# Patient Record
Sex: Female | Born: 1960 | Race: Black or African American | Hispanic: No | Marital: Single | Smoking: Former smoker
Health system: Southern US, Community
[De-identification: ages and names within clinical notes are randomized; demographics above are authoritative.]

## PROBLEM LIST (undated history)

## (undated) DIAGNOSIS — E039 Hypothyroidism, unspecified: Secondary | ICD-10-CM

## (undated) DIAGNOSIS — R1032 Left lower quadrant pain: Secondary | ICD-10-CM

## (undated) DIAGNOSIS — I10 Essential (primary) hypertension: Secondary | ICD-10-CM

## (undated) DIAGNOSIS — N920 Excessive and frequent menstruation with regular cycle: Secondary | ICD-10-CM

## (undated) DIAGNOSIS — J45909 Unspecified asthma, uncomplicated: Secondary | ICD-10-CM

## (undated) DIAGNOSIS — E669 Obesity, unspecified: Secondary | ICD-10-CM

## (undated) DIAGNOSIS — F419 Anxiety disorder, unspecified: Secondary | ICD-10-CM

## (undated) DIAGNOSIS — D219 Benign neoplasm of connective and other soft tissue, unspecified: Secondary | ICD-10-CM

## (undated) DIAGNOSIS — D649 Anemia, unspecified: Secondary | ICD-10-CM

## (undated) DIAGNOSIS — C519 Malignant neoplasm of vulva, unspecified: Secondary | ICD-10-CM

## (undated) DIAGNOSIS — N946 Dysmenorrhea, unspecified: Secondary | ICD-10-CM

## (undated) HISTORY — DX: Excessive and frequent menstruation with regular cycle: N92.0

## (undated) HISTORY — PX: ENDOMETRIAL BIOPSY: SHX622

## (undated) HISTORY — PX: ROUX-EN-Y GASTRIC BYPASS: SHX1104

## (undated) HISTORY — PX: OTHER SURGICAL HISTORY: SHX169

## (undated) HISTORY — PX: ABDOMINAL HYSTERECTOMY: SHX81

## (undated) HISTORY — DX: Anxiety disorder, unspecified: F41.9

## (undated) HISTORY — DX: Obesity, unspecified: E66.9

## (undated) HISTORY — DX: Malignant neoplasm of vulva, unspecified: C51.9

## (undated) HISTORY — DX: Benign neoplasm of connective and other soft tissue, unspecified: D21.9

## (undated) HISTORY — DX: Dysmenorrhea, unspecified: N94.6

## (undated) HISTORY — DX: Left lower quadrant pain: R10.32

## (undated) HISTORY — DX: Anemia, unspecified: D64.9

## (undated) HISTORY — DX: Essential (primary) hypertension: I10

---

## 2000-03-22 ENCOUNTER — Emergency Department (HOSPITAL_COMMUNITY): Admission: EM | Admit: 2000-03-22 | Discharge: 2000-03-22 | Payer: Self-pay | Admitting: Emergency Medicine

## 2000-03-22 ENCOUNTER — Encounter: Payer: Self-pay | Admitting: Emergency Medicine

## 2001-06-09 ENCOUNTER — Encounter: Payer: Self-pay | Admitting: Family Medicine

## 2001-06-09 ENCOUNTER — Ambulatory Visit (HOSPITAL_COMMUNITY): Admission: RE | Admit: 2001-06-09 | Discharge: 2001-06-09 | Payer: Self-pay | Admitting: Family Medicine

## 2001-07-15 ENCOUNTER — Encounter: Admission: RE | Admit: 2001-07-15 | Discharge: 2001-10-13 | Payer: Self-pay | Admitting: Family Medicine

## 2001-11-18 ENCOUNTER — Encounter: Admission: RE | Admit: 2001-11-18 | Discharge: 2001-11-18 | Payer: Self-pay | Admitting: Family Medicine

## 2002-01-17 ENCOUNTER — Inpatient Hospital Stay (HOSPITAL_COMMUNITY): Admission: RE | Admit: 2002-01-17 | Discharge: 2002-01-24 | Payer: Self-pay | Admitting: Obstetrics and Gynecology

## 2002-01-17 ENCOUNTER — Encounter (INDEPENDENT_AMBULATORY_CARE_PROVIDER_SITE_OTHER): Payer: Self-pay | Admitting: Specialist

## 2002-01-19 ENCOUNTER — Encounter: Payer: Self-pay | Admitting: Obstetrics and Gynecology

## 2002-01-20 ENCOUNTER — Encounter: Payer: Self-pay | Admitting: Obstetrics and Gynecology

## 2002-09-28 ENCOUNTER — Encounter (INDEPENDENT_AMBULATORY_CARE_PROVIDER_SITE_OTHER): Payer: Self-pay | Admitting: *Deleted

## 2002-09-28 ENCOUNTER — Encounter: Payer: Self-pay | Admitting: Family Medicine

## 2002-09-28 ENCOUNTER — Ambulatory Visit (HOSPITAL_COMMUNITY): Admission: RE | Admit: 2002-09-28 | Discharge: 2002-09-28 | Payer: Self-pay | Admitting: Family Medicine

## 2002-10-31 ENCOUNTER — Other Ambulatory Visit: Admission: RE | Admit: 2002-10-31 | Discharge: 2002-10-31 | Payer: Self-pay | Admitting: Obstetrics and Gynecology

## 2003-03-21 ENCOUNTER — Encounter: Admission: RE | Admit: 2003-03-21 | Discharge: 2003-06-19 | Payer: Self-pay | Admitting: Family Medicine

## 2005-05-06 ENCOUNTER — Other Ambulatory Visit: Admission: RE | Admit: 2005-05-06 | Discharge: 2005-05-06 | Payer: Self-pay | Admitting: Family Medicine

## 2007-02-10 ENCOUNTER — Ambulatory Visit (HOSPITAL_COMMUNITY): Admission: RE | Admit: 2007-02-10 | Discharge: 2007-02-10 | Payer: Self-pay | Admitting: General Surgery

## 2007-02-11 ENCOUNTER — Ambulatory Visit (HOSPITAL_COMMUNITY): Admission: RE | Admit: 2007-02-11 | Discharge: 2007-02-11 | Payer: Self-pay | Admitting: General Surgery

## 2007-03-24 ENCOUNTER — Encounter: Admission: RE | Admit: 2007-03-24 | Discharge: 2007-03-24 | Payer: Self-pay | Admitting: General Surgery

## 2007-04-08 ENCOUNTER — Ambulatory Visit (HOSPITAL_COMMUNITY): Admission: RE | Admit: 2007-04-08 | Discharge: 2007-04-08 | Payer: Self-pay | Admitting: General Surgery

## 2008-01-24 ENCOUNTER — Encounter: Admission: RE | Admit: 2008-01-24 | Discharge: 2008-04-23 | Payer: Self-pay | Admitting: General Surgery

## 2008-02-07 ENCOUNTER — Inpatient Hospital Stay (HOSPITAL_COMMUNITY): Admission: RE | Admit: 2008-02-07 | Discharge: 2008-02-09 | Payer: Self-pay | Admitting: Surgery

## 2008-02-08 ENCOUNTER — Encounter (INDEPENDENT_AMBULATORY_CARE_PROVIDER_SITE_OTHER): Payer: Self-pay | Admitting: General Surgery

## 2008-02-08 ENCOUNTER — Ambulatory Visit: Payer: Self-pay | Admitting: Vascular Surgery

## 2008-02-23 ENCOUNTER — Encounter: Admission: RE | Admit: 2008-02-23 | Discharge: 2008-02-23 | Payer: Self-pay | Admitting: General Surgery

## 2008-05-03 ENCOUNTER — Encounter: Admission: RE | Admit: 2008-05-03 | Discharge: 2008-05-03 | Payer: Self-pay | Admitting: General Surgery

## 2008-08-02 ENCOUNTER — Encounter: Admission: RE | Admit: 2008-08-02 | Discharge: 2008-10-31 | Payer: Self-pay | Admitting: General Surgery

## 2008-11-15 IMAGING — CR DG CHEST 2V
2 series · 2 of 2 positions shown · non-contrast
Comparison: none

CLINICAL DATA: Morbid obesity.  Hypertension.  Preop evaluation for bariatric surgery.
 CHEST - 2 VIEW:
 The heart size and mediastinal contours are within normal limits.  Both lungs are clear.  The visualized skeletal structures are unremarkable.

[view not recorded (1 of 2)]
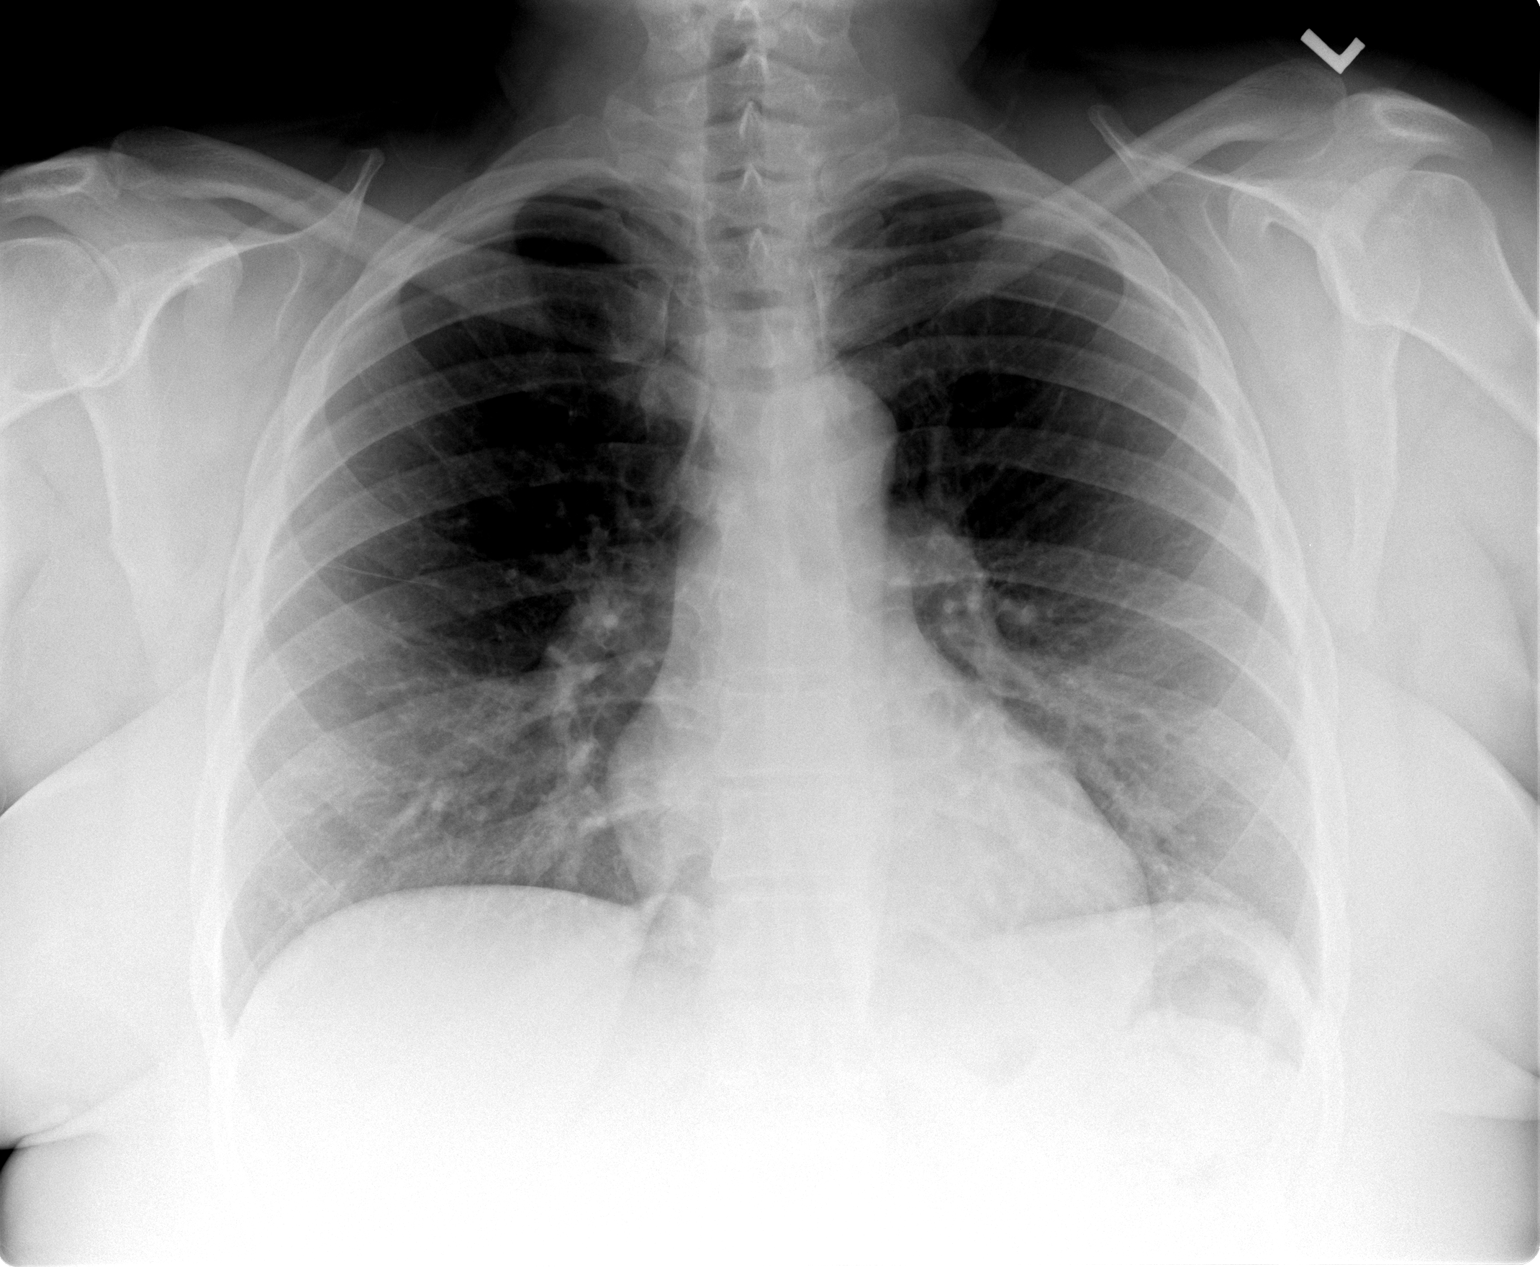

[view not recorded (2 of 2)]
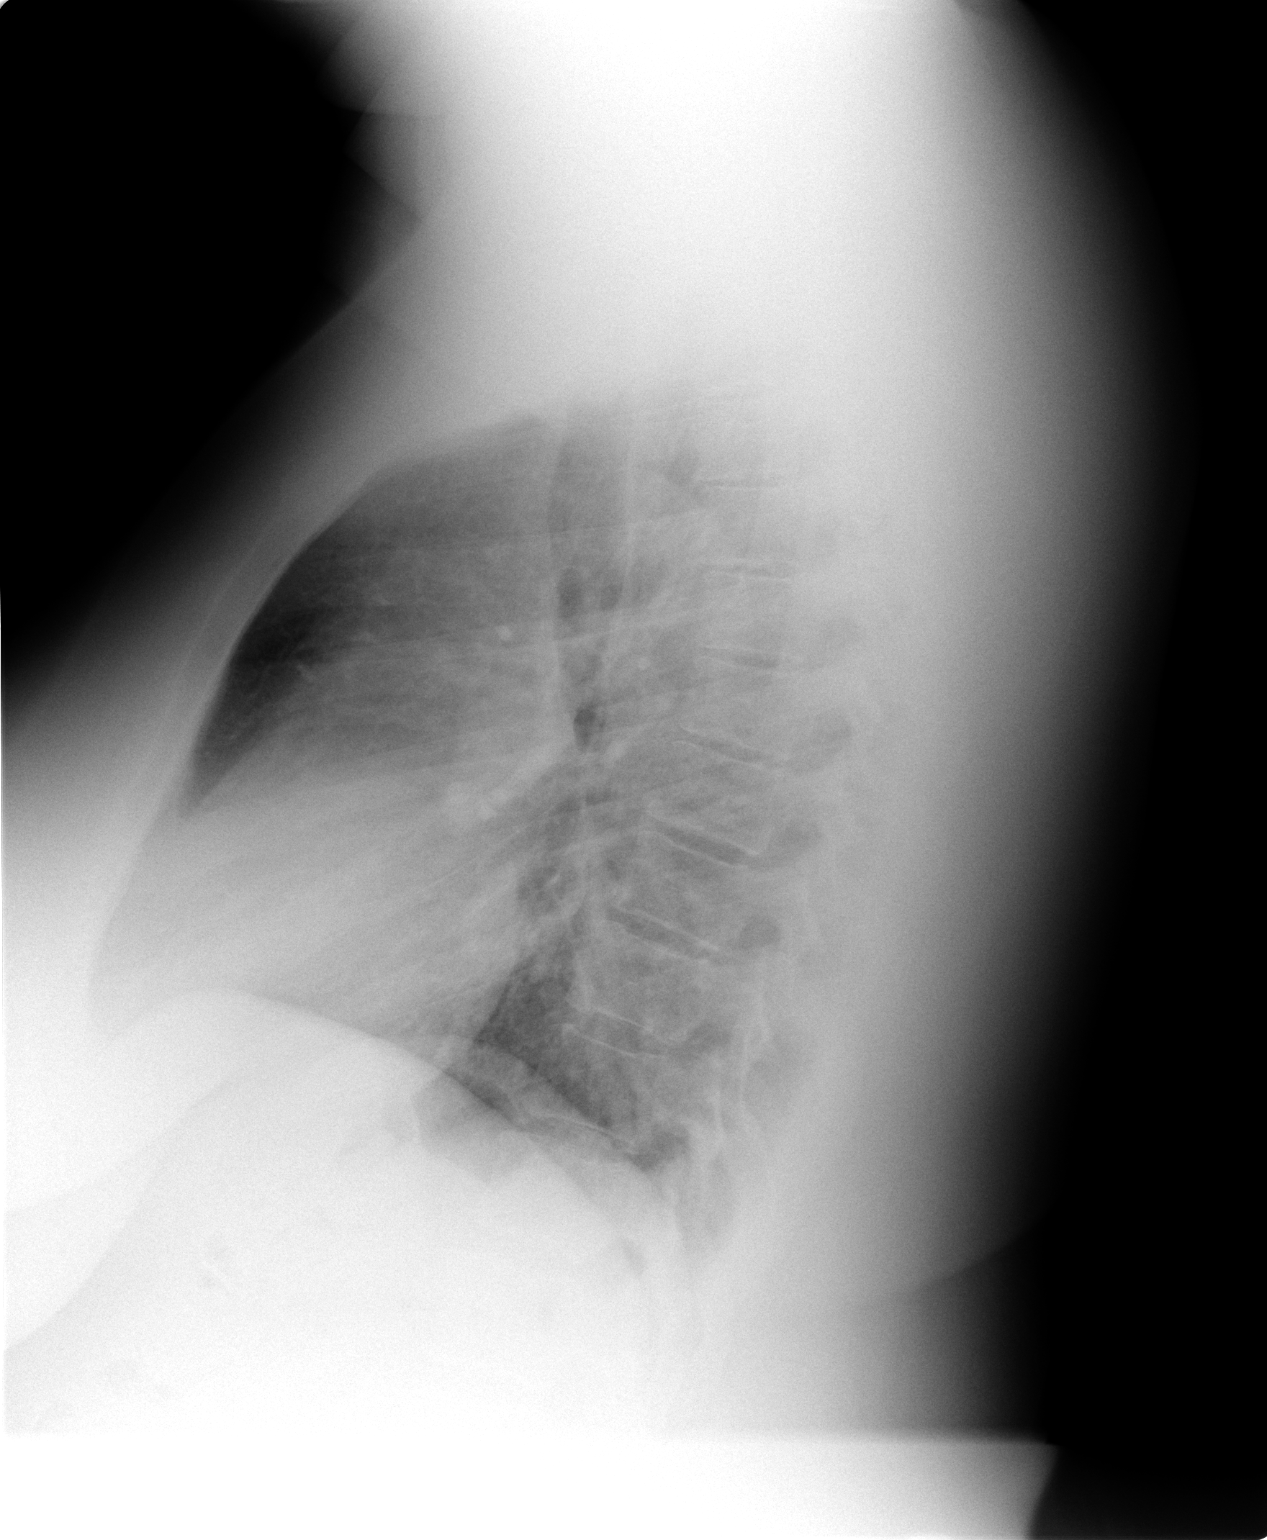

[2 of 2 positions shown; findings below may reference images not displayed]

IMPRESSION: No active cardiopulmonary disease.

## 2009-05-16 ENCOUNTER — Encounter: Admission: RE | Admit: 2009-05-16 | Discharge: 2009-05-16 | Payer: Self-pay | Admitting: General Surgery

## 2009-11-27 IMAGING — CR DG CHEST 2V
2 series · 2 of 2 positions shown · non-contrast
Comparison: [REDACTED] chest x-ray 02/11/2007.

CLINICAL DATA: Left upper chest and shoulder pain.  Gastric bypass
02/07/2008.

CHEST - 2 VIEW

[w chest pa]
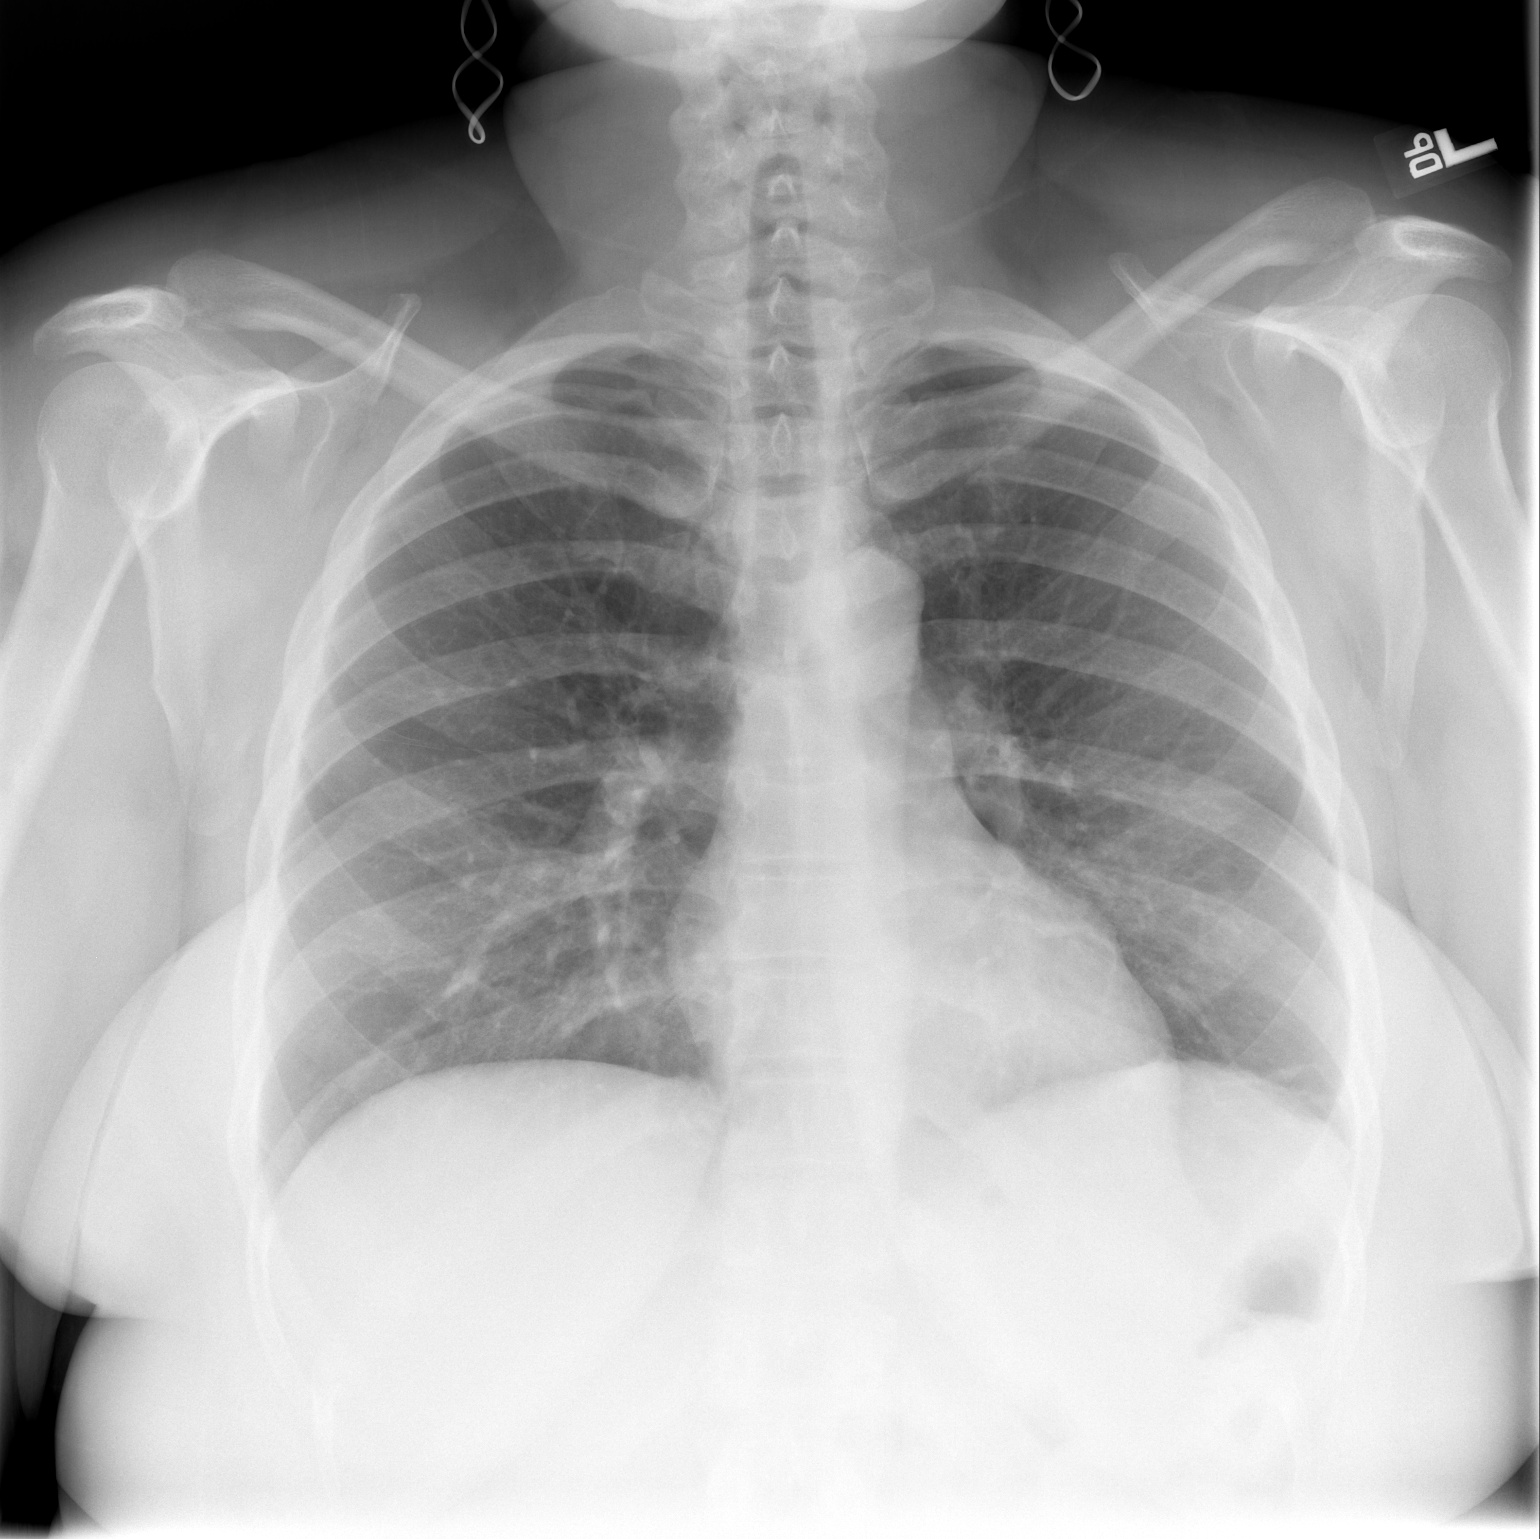

[w chest lat]
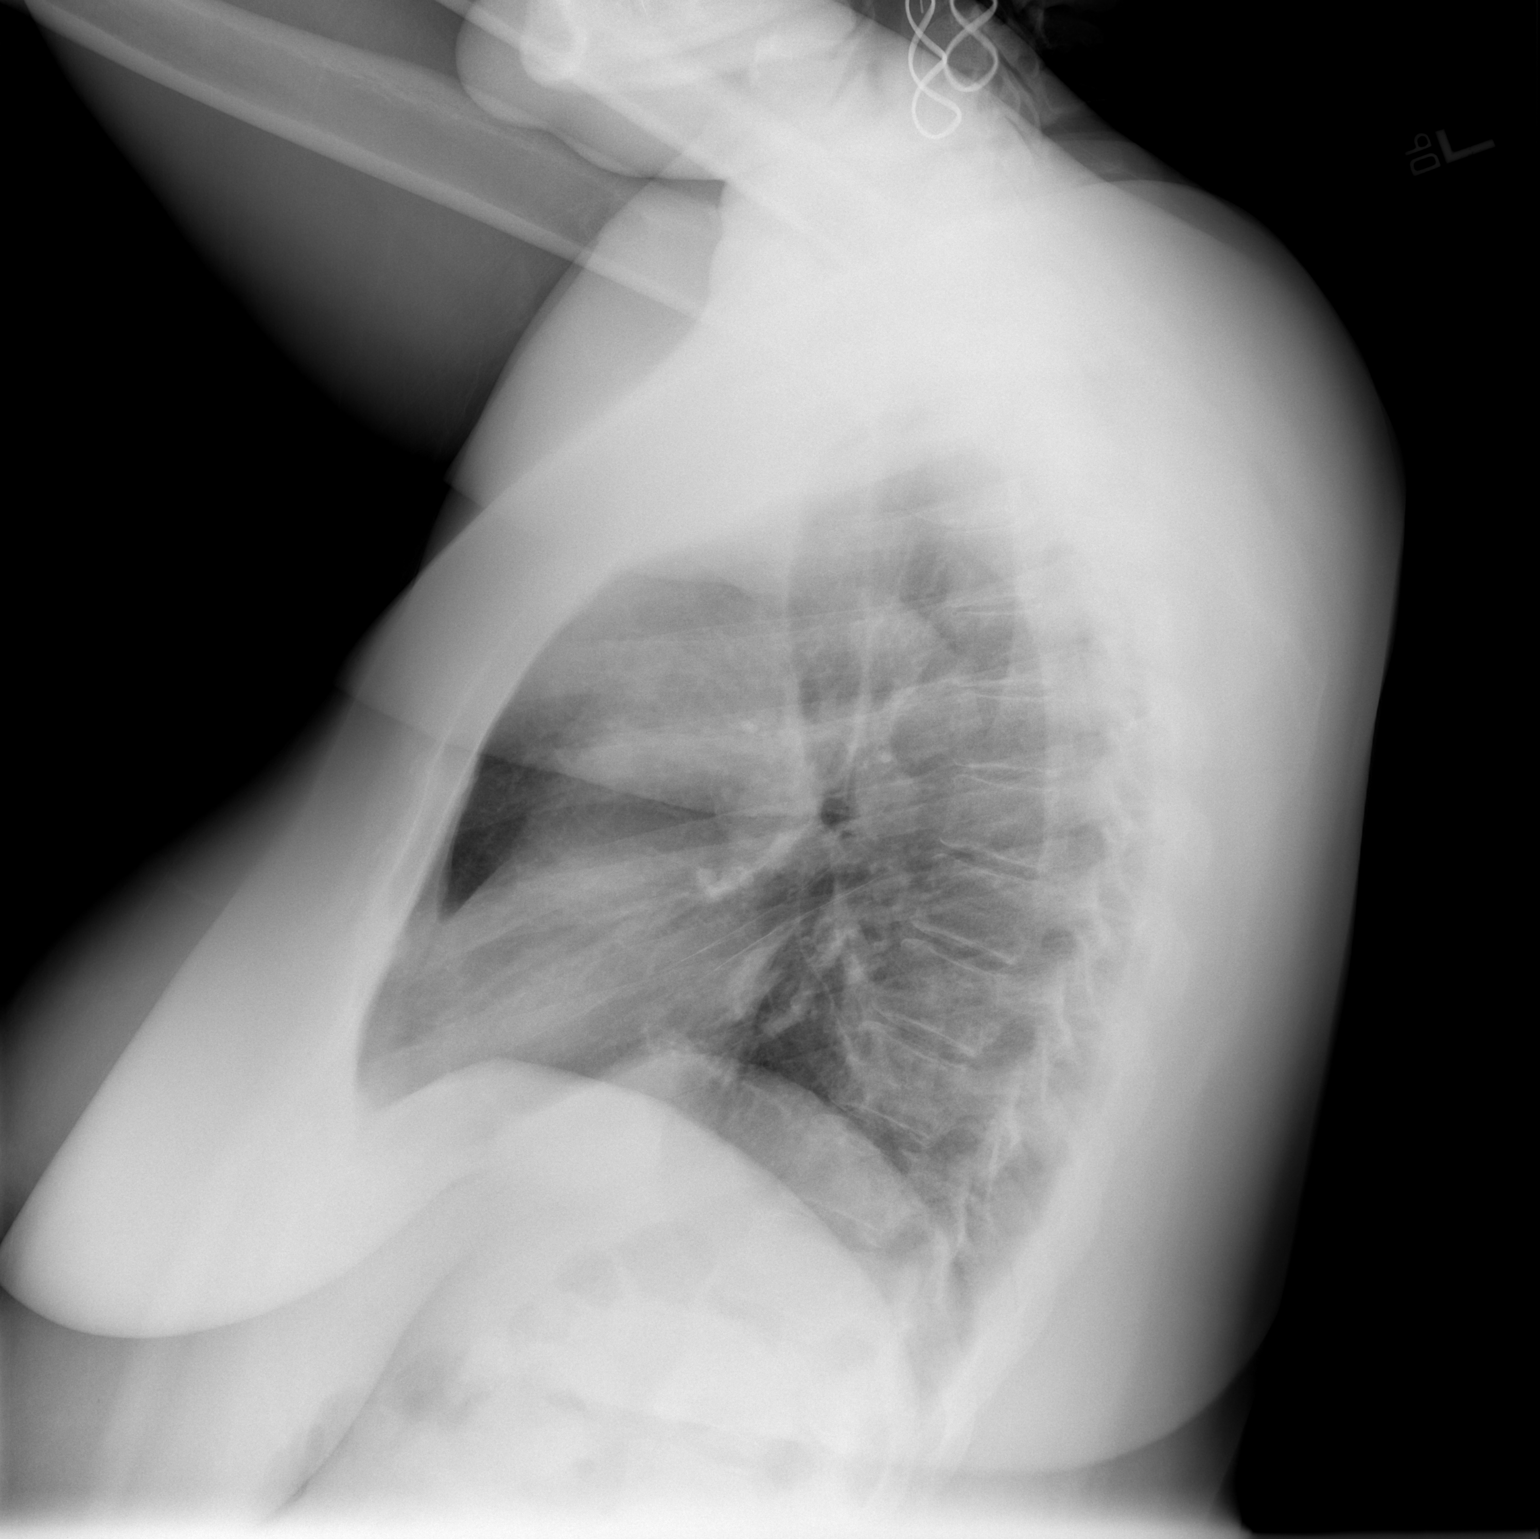

[2 of 2 positions shown; findings below may reference images not displayed]

FINDINGS: Submaximal inspiration is seen with minimal linear
atelectasis at the lateral left lung base.  Lungs are otherwise
clear.  Heart size and configuration normal.  Mediastinum, hila,
pleura osseous structures and upper gastrointestinal gas pattern
appear normal without pneumoperitoneum.
IMPRESSION: No active cardiopulmonary disease.

## 2010-04-21 LAB — DIFFERENTIAL
Basophils Relative: 0 % (ref 0–1)
Lymphs Abs: 2.6 10*3/uL (ref 0.7–4.0)
Monocytes Absolute: 0.4 10*3/uL (ref 0.1–1.0)
Monocytes Relative: 5 % (ref 3–12)
Neutro Abs: 4.1 10*3/uL (ref 1.7–7.7)
Neutrophils Relative %: 57 % (ref 43–77)

## 2010-04-21 LAB — CBC
MCHC: 33.1 g/dL (ref 30.0–36.0)
Platelets: 294 10*3/uL (ref 150–400)
RDW: 14.8 % (ref 11.5–15.5)

## 2010-04-21 LAB — COMPREHENSIVE METABOLIC PANEL
ALT: 30 U/L (ref 0–35)
Albumin: 4.1 g/dL (ref 3.5–5.2)
Alkaline Phosphatase: 99 U/L (ref 39–117)
Calcium: 9.8 mg/dL (ref 8.4–10.5)
Potassium: 3.6 mEq/L (ref 3.5–5.1)
Sodium: 138 mEq/L (ref 135–145)
Total Protein: 7.5 g/dL (ref 6.0–8.3)

## 2010-04-22 LAB — DIFFERENTIAL
Basophils Relative: 1 % (ref 0–1)
Eosinophils Absolute: 0 10*3/uL (ref 0.0–0.7)
Eosinophils Relative: 0 % (ref 0–5)
Eosinophils Relative: 0 % (ref 0–5)
Lymphocytes Relative: 20 % (ref 12–46)
Lymphs Abs: 1.4 10*3/uL (ref 0.7–4.0)
Monocytes Absolute: 0.3 10*3/uL (ref 0.1–1.0)
Monocytes Absolute: 0.4 10*3/uL (ref 0.1–1.0)
Monocytes Relative: 4 % (ref 3–12)
Monocytes Relative: 5 % (ref 3–12)
Neutro Abs: 5.7 10*3/uL (ref 1.7–7.7)

## 2010-04-22 LAB — GLUCOSE, CAPILLARY
Glucose-Capillary: 140 mg/dL — ABNORMAL HIGH (ref 70–99)
Glucose-Capillary: 141 mg/dL — ABNORMAL HIGH (ref 70–99)
Glucose-Capillary: 147 mg/dL — ABNORMAL HIGH (ref 70–99)
Glucose-Capillary: 149 mg/dL — ABNORMAL HIGH (ref 70–99)
Glucose-Capillary: 171 mg/dL — ABNORMAL HIGH (ref 70–99)
Glucose-Capillary: 177 mg/dL — ABNORMAL HIGH (ref 70–99)
Glucose-Capillary: 190 mg/dL — ABNORMAL HIGH (ref 70–99)
Glucose-Capillary: 207 mg/dL — ABNORMAL HIGH (ref 70–99)
Glucose-Capillary: 251 mg/dL — ABNORMAL HIGH (ref 70–99)

## 2010-04-22 LAB — CBC
HCT: 29.5 % — ABNORMAL LOW (ref 36.0–46.0)
HCT: 34.2 % — ABNORMAL LOW (ref 36.0–46.0)
Hemoglobin: 11.5 g/dL — ABNORMAL LOW (ref 12.0–15.0)
Hemoglobin: 9.8 g/dL — ABNORMAL LOW (ref 12.0–15.0)
MCHC: 33.1 g/dL (ref 30.0–36.0)
MCV: 81.5 fL (ref 78.0–100.0)
RBC: 3.56 MIL/uL — ABNORMAL LOW (ref 3.87–5.11)
RBC: 4.19 MIL/uL (ref 3.87–5.11)
WBC: 7 10*3/uL (ref 4.0–10.5)

## 2010-04-22 LAB — HEMOGLOBIN AND HEMATOCRIT, BLOOD
HCT: 31.7 % — ABNORMAL LOW (ref 36.0–46.0)
HCT: 37.9 % (ref 36.0–46.0)
Hemoglobin: 10.6 g/dL — ABNORMAL LOW (ref 12.0–15.0)
Hemoglobin: 12.2 g/dL (ref 12.0–15.0)

## 2010-05-20 NOTE — Op Note (Signed)
Ann Mills, Ann Mills              ACCOUNT NO.:  [id number]   MEDICAL RECORD NO.:  [id number]          PATIENT TYPE:  INP   LOCATION:  0002                         FACILITY:  [facility]   PHYSICIAN:  Sharlet Salina T. Hoxworth, M.D.DATE OF BIRTH:  Sep 02, 1960   DATE OF PROCEDURE:  02/07/2008  DATE OF DISCHARGE:                               OPERATIVE REPORT   PREOPERATIVE DIAGNOSIS:  Morbid obesity.   POSTOPERATIVE DIAGNOSIS:  Morbid obesity.   SURGICAL PROCEDURES:  Laparoscopic Roux-en-Y gastric bypass.   SURGEON:  Lorne Skeens. Hoxworth, M.D.   ASSISTANT:  Dr. Baruch Merl.   ANESTHESIA:  General.   BRIEF HISTORY:  [name] is a 50 year old female who presents with  progressive morbid obesity unresponsive to medical management with a  weight at presentation of 312 pounds, BMI of 48.73.  She has  comorbidities of adult-onset diabetes mellitus, hypertension, arthritis,  and elevated cholesterol.  After extensive preoperative discussion  workup detailed elsewhere, we have elected to proceed with Roux-en-Y  gastric bypass for treatment of morbid obesity.  She is brought to the  operating room for this procedure.   DESCRIPTION OF OPERATION:  The patient brought to the operating room and  placed in supine position on the operating table and general  endotracheal anesthesia was induced.  The abdomen was widely sterilely  prepped and draped.  Lovenox had been given subcutaneously  preoperatively.  IV antibiotics had been given.  PAS were in place.  Correct patient and procedure were verified.  Access was obtained with  11 mm OptiView trocar in the left upper quadrant midclavicular line  without difficulty and pneumoperitoneum established.  There was no  evidence of trocar injury.  Under direct vision, a 12 mm trocar was  placed laterally in the right upper quadrant  Another 12 mm trocar about  the midclavicular line, right upper quadrant and 11 mm trocar just above  and to the left of the  umbilicus for the camera, the 5 mm trocar in the  left flank.  The omentum was minimal.  It was elevated and the ligament  Treitz identified.  Approximately 40 cm afferent limb was then measured,  at which point the bowel mesentery were nicely mobile up toward the edge  of liver.  Small bowel was divided this point with a firing of the 60 mm  white load stapler and this was completed and the mesentery divided a  short way with another firing of the 45 mm white load stapler.  A  Penrose drain was sutured into the end of the Roux limb for  identification.  A 100 cm Roux limb was then carefully measured.  At  this point, a jejunojejunostomy was created with a single firing of the  white load 45 mm stapler through enterotomies created with Harmonic  scalpel.  The staple line was inspected and was intact without bleeding.  The common enterotomy was closed with running 2-0 Vicryl begun at either  end of the enterotomy and tied centrally.  The mesenteric defect was  then closed with a running silk.  The anastomosis appeared widely patent  with good  blood supply under no tension.  The suture and staple lines  were coated with Tisseel tissue sealant.  The patient was then placed in  steep reverse Trendelenburg and through a 5-mm subxiphoid site the  Nathanson's retractor was placed and left lobe of the liver elevated.  The stomach hiatus appeared normal.  The angle of His was mobilized with  Harmonic scalpel.  A 4-5 cm pouch was then measured along the lesser  curve and at this point, the peritoneum was incised with the Harmonic  scalpel and careful dissection was carried along the wall of the stomach  toward the lesser sac with Harmonic scalpel and blunt dissection until  the lesser sac was entered freely.  An initial firing of the gold 60 mm  stapler was performed at right angles across the lesser curve.  Another  firing of blue 60 mm stapler up toward the angle of His nearly completed  the pouch  and one further firing of the 45 mm stapler completely divided  the pouch.  The staple line of the gastric remnant was then oversewn  with a running locking 2-0 silk.  The Roux limb was then brought up and  an anastomosis performed to the pouch with initial posterior row of  seromuscular 2-0 Vicryl.  Enterotomies were made in the pouch and the  Roux limb with Harmonic scalpel and then a blue load 45 mm stapler was  used to create an approximately 2 cm anastomosis.  The staple line was  inspected, was intact without bleeding.  The common enterotomy was then  closed from either end with a running 2-0 Vicryl.  There appeared to be  one slight gap at the completion of suture line which was closed with an  additional figure-of-eight 2-0 Vicryl.  Following this, the Ewald tube  was passed down through the anastomosis and an outer seromuscular layer  anteriorly of 2-0 Vicryl was placed.  At this point the [facility] defect  was exposed and this was closed suturing the mesentery of the Roux limb  down to the mesentery of the transverse colon up over the transverse  colon to the lesser omentum.  This was done with 2-0 silk.  This point  Dr. Colin Benton performed upper endoscopy and with the outlet of the Roux limb  clamped and under saline irrigation with the pouch tensely distended,  there was no evidence of air leak.  The air was desufflated.  The  abdomen was suctioned.  Hemostasis assured.  There is no evidence of  trocar injury.  Tisseel was used to coat the suture and staple lines of  the gastrojejunostomy.  All CO2 was evacuated.  Trocars were removed.  The Nathanson's retractor removed.  The skin incisions were closed with  staples.  Sponge, needle and instrument counts were correct.  The  patient was taken to recovery in good condition.      Lorne Skeens. Hoxworth, M.D.  Electronically Signed     BTH/MEDQ  D:  02/07/2008  T:  02/08/2008  Job:  [id number]

## 2010-05-23 NOTE — H&P (Signed)
NAME:  Ann Mills, Ann Mills                        ACCOUNT NO.:  [id number]   MEDICAL RECORD NO.:  [id number]                   PATIENT TYPE:  AMB   LOCATION:  SDC                                  [facility]ACILITY:  [facility]   PHYSICIAN:  Janine Limbo, M.D.            DATE OF BIRTH:  1960-08-16   DATE OF ADMISSION:  01/17/2002  DATE OF DISCHARGE:                                HISTORY & PHYSICAL   HISTORY OF PRESENT ILLNESS:  The patient is a 50 year old female, gravida 0,  who complains of menorrhagia, dysmenorrhea, and fibroids.  An ultrasound was  performed that showed a multifibroid uterus.  An endometrial biopsy was  benign.  Her most recent Pap smear was within normal limits.  The patient  has been treated with hormonal therapy, and we were unsuccessful in  controlling her symptoms.  Nonsteroidal anti-inflammatory agents have not  relieved her discomfort.  The patient denies a past history of sexually  transmitted infections although she has been told in the past that she had  infections in her ovaries.   DRUG ALLERGIES:  No known drug allergies but the patient is allergic to  chicken feathers and animal dander.   PAST MEDICAL HISTORY:  The patient is currently being treated for  hypertension, hyperlipidemia, and glucose intolerance.   CURRENT MEDICATIONS:  1. Covera HS 180 mg each day.  2. Hydrochlorothiazide 25 mg each day.  3. Calcium supplements.  4. Multivitamins.  5. Iron therapy.   SOCIAL HISTORY:  The patient smokes one pack of cigarettes every three days.  She drinks alcohol socially.  She denies other recreational drug uses.   REVIEW OF SYSTEMS:  The patient is obese.  She had her wisdom teeth removed  in the past and has no problems.   FAMILY HISTORY:  The patient has a family history of heart disease, sickle  cell trait, hypertension, and diabetes.   PHYSICAL EXAMINATION:  VITAL SIGNS:  Weight is 266 pounds.  HEENT:  Within normal limits.  CHEST:  Clear.  HEART:   Regular rate and rhythm.  BREASTS:  Her breasts are without masses.  ABDOMEN:  Nontender.  EXTREMITIES:  Within normal limits.  NEUROLOGICAL:  Exam is grossly normal.  PELVIC:  External genitalia is normal.  The vagina is normal.  The cervix is  nontender.  The uterus is upper limits normal size and nontender.  Adnexa no  masses.  Rectovaginal exam confirms.   ASSESSMENT:  1. Fibroid uterus.  2. Dysmenorrhea.  3. Menorrhagia.  4. Nulliparous.   PLAN:  The patient will undergo a laparoscopically assisted vaginal  hysterectomy.  She understands the indications for her procedure and she  accepts the risks of, but not limited to, anesthetic complications,  bleeding, infections, and possible damage to the surrounding organs.  Janine Limbo, M.D.    AVS/MEDQ  D:  01/16/2002  T:  01/16/2002  Job:  [id number]   cc:   Pam Drown, M.D.  [address]  [place]  [place] [address]  Fax: [phone]

## 2010-05-23 NOTE — Discharge Summary (Signed)
NAME:  Ann Mills, Ann Mills                        ACCOUNT NO.:  [id number]   MEDICAL RECORD NO.:  [id number]                   PATIENT TYPE:  INP   LOCATION:  9301                                 [facility]ACILITY:  [facility]   PHYSICIAN:  Janine Limbo, M.D.            DATE OF BIRTH:  10/06/1960   DATE OF ADMISSION:  01/17/2002  DATE OF DISCHARGE:  01/24/2002                                 DISCHARGE SUMMARY   DISCHARGE DIAGNOSES:  1. Fibroid uterus (609 grams).  2. Endometriosis.  3. Dysmenorrhea.  4. Menorrhagia.  5. Anemia.  6. Obesity.   OPERATION ON ADMISSION:  The patient underwent an attempted laparoscopically  assisted vaginal hysterectomy, which reverted to a total abdominal  hysterectomy due to poor descent of the uterus.  The patient was found to  have a 16-week size uterus with multiple fibroids (weighing 609 grams), with  normal tubes and ovaries.  Additionally, she was found to have peritoneal  implants in the anterior and posterior cul-de-sacs, which were consistent  with endometriosis.   HISTORY OF PRESENT ILLNESS:  The patient is a 50 year old female gravida 0,  who complains of menorrhagia, dysmenorrhea and fibroids, and has consented  for definitive treatment in the form of a hysterectomy for her symptoms.  Please see the patient's dictated History and Physical Examination for  details.   PHYSICAL EXAMINATION:  VITAL SIGNS:  Weight 266 pounds.  GENERAL:  Within normal limits.  PELVIC:  External genitalia is normal.  Vagina is normal.  Cervix is  nontender.  Uterus is upper limits of normal size and nontender.  Adnexa no  masses.  Rectovaginal exam confirms.   HOSPITAL COURSE:  On the day of admission, the patient underwent  aforementioned procedures, tolerating them all well.  Postoperative course  was marked by the patient's hemoglobin dropping to 6.9 postoperatively, from  a preoperative hemoglobin of 11.3.  The patient underwent a series of  evaluations to  determine the source of this drop which included a CT scan of  the pelvis, which was unsatisfactory for evaluation of her pelvis, a chest x-  ray which revealed bibasilar atelectasis with bilateral pleural effusions  (basilar infiltrates were not able to be excluded with certainty).  Lastly,  the patient underwent a pelvic ultrasound which showed a pelvic clot between  the bladder and the vaginal apex, measuring 3.9 x 8.2 x 6.3 cm.  Additionally, she had free pelvic fluid with echos.  On postoperative day #3  an attempt was made to drain this pelvic fluid with minimal success.  The  patient began antibiotics as she had spiked a temperature as high as 101.3.  The patient slowly defervesced, resumed bowel or bladder function by  postoperative day #7, and was therefore deemed for discharge home.   DISCHARGE MEDICATIONS:  1. Vicodin 1-2 tablets every 4-6 hours as needed for pain.  2. Phenergan 25 mg 1 tablet every 6 hours as needed  for nausea.  3. Augmentin 500 mg 1 tablet three times daily for 10 days.  4. The patient was also instructed to resume her pre hospital medications.   FOLLOWUP:  The patient is scheduled for a six weeks postoperative exam with  Dr. Janine Limbo, at [facility].   DISCHARGE INSTRUCTIONS:  The patient was given a copy of [facility] Postoperative Instruction sheet.  She was further  advised to avoid driving for two weeks, heavy lifting for four weeks, and  intercourse for six weeks.  The patient was also encouraged to call for  temperature greater than 101, severe problems or any concerns.  The patient  also was asked that she would measure her temperature three times daily for  the next two weeks.  Her diet is without restrictions.   FINAL PATHOLOGY:  1. Cul-de-sac left anterior biopsy revealed benign endometrial type gland     and stoma consistent with endometriosis.  2. Uterus and cervix  hysterectomy:  Uterus and cervix with benign     ectocervical and endocervical tissue, focal squamous metaplasia, uterine     corpus with benign proliferative endometrium, submucosal intramural and     subserosal leiomyomata and focal implantation site reaction, serosal     cervix with endometriosis.     Elmira J. Adline Peals.                    Janine Limbo, M.D.    EJP/MEDQ  D:  02/21/2002  T:  02/21/2002  Job:  [id number]   cc:   Pam Drown, M.D.  [address]  [place]  [place] [address]  Fax: [phone]

## 2010-05-27 NOTE — Op Note (Signed)
NAME:  Ann Mills, Ann Mills                        ACCOUNT NO.:  [id number]   MEDICAL RECORD NO.:  [id number]                   PATIENT TYPE:  OBV   LOCATION:  9399                                 [facility]ACILITY:  [facility]   PHYSICIAN:  Janine Limbo, M.D.            DATE OF BIRTH:  28-Jan-1960   DATE OF PROCEDURE:  01/17/2002  DATE OF DISCHARGE:                                 OPERATIVE REPORT   PREOPERATIVE DIAGNOSES:  1. A 16-week size fibroid uterus.  2. Dysmenorrhea.  3. Menorrhagia.  4. Anemia.  5. Obesity (weight 256 pounds).   POSTOPERATIVE DIAGNOSES:  1. A 16-week size fibroid uterus.  2. Dysmenorrhea.  3. Menorrhagia.  4. Anemia.  5. Obesity (weight 256 pounds).  6. Endometriosis.  7. Poor descent of the uterus.   PROCEDURES:  Unsuccessful laparoscopically-assisted vaginal hysterectomy,  total abdominal hysterectomy.   SURGEON:  Janine Limbo, M.D.   FIRST ASSISTANT:  Osborn Coho, M.D.   ANESTHESIA:  General.   DISPOSITION:  The patient is a 50 year old female, gravida zero, who  presented with a 16-week size fibroid uterus.  She has had dysmenorrhea,  menorrhagia, and anemia.  Her discomforts have not responded to hormonal  therapy nor pain medication.  She understands the indications for her  surgical procedure and she accepted the risks of, but not limited to,  anesthetic complications, bleeding, infections, and possible damage to the  surrounding organs.   FINDINGS:  The patient had a 16-week size multifibroid uterus.  The  fallopian tubes and the ovaries appeared normal.  There were hyperpigmented  lesions in the anterior cul-de-sac and the posterior cul-de-sac consistent  with endometriosis.  The lesions measured approximately 1 cm in size.   DESCRIPTION OF PROCEDURE:  The patient was taken to the operating room,  where a general anesthetic was given.  The patient's abdomen, perineum, and  vagina were prepped with multiple layers of Hibiclens.  A  Foley catheter was  placed in the bladder.  Examination under anesthesia was performed.  The  patient was sterilely draped.  A subumbilical incision was made and a Veress  needle was inserted into the abdominal cavity without difficulty.  Proper  placement was confirmed using the saline drop test.  A pneumoperitoneum was  the obtained.  The laparoscopic trocar and the laparoscope were substituted  for the Veress needle.  The pelvic organs were inspected.  Two suprapubic  incisions were made and two 5 mm trocars were placed in the lower abdomen  under direct visualization.  Pictures were taken of the patient's pelvic  anatomy.  We identified the left round ligament and the left fallopian tube.  These structures were cauterized and cut.  The left utero-ovarian ligament  was then identified and cauterized.  The utero-ovarian ligament was cut.  The uterine arteries on the left side were skeletonized.  An identical  procedure was carried out on the opposite side.  The  bladder flap was  incised anteriorly.  Biopsies were obtained of the endometriotic lesions in  the left anterior cul-de-sac.  At this point, we felt that we were ready to  proceed with the vaginal portion of our procedure.  The patient was placed  in a more lithotomy position.  We noticed that there was very poor descent  of the uterus in addition to the fact that there was very poor visualization  because there was no pelvic relaxation present.  The procedure was also  complicated by the fact that the patient weighed 266 pounds and the buttock  muscles were making it difficult to retract.  We injected the cervix with a  diluted solution of Pitressin and saline.  A circumferential incision was  made around the cervix and the vaginal mucosa was advanced both anteriorly  and posteriorly.  Multiple attempts were made to enter the anterior cul-de-  sac and the posterior cul-de-sac.  We were unable to do so.  After multiple  attempts  that were thought to be appropriate, the decision was made to  abandon our attempts at laparoscopically-assisted vaginal hysterectomy and  to proceed with abdominal hysterectomy.  The patient was placed in a more  supine position.  The operators then changed gowns and gloves.  A low-  transverse incision was made in the abdomen and the incision was extended  through the subcutaneous tissue, the fascia, and the anterior peritoneum.  An abdominal wall retractor was placed.  The uterus was elevated into our  operative field.  The bladder flap was then completely developed.  The  uterine arteries were clamped, cut, sutured, and tied securely.  The  parametrial tissues were then clamped, cut, sutured, and tied securely.  The  fundus of the uterus was then transected from the cervix to aid in  visualization.  We were then able to clamp and cut the paracervical tissues  as well as the uterosacral ligaments and the vaginal angles.  These areas  were sutured and tied securely.  The cervix was transected from the apex of  the vagina.  The vaginal cuff was closed using figure-of-eight sutures.  Care was taken to identify the vaginal angles and to suture the cuff  entirely.  Hemostasis was achieved using figure-of-eight sutures.  Care was  taken not to damage the ureters and there was no evidence of ureteral  damage.  The pelvis was then irrigated.  Hemostasis was thought to be  adequate.  The uterosacral ligaments were tied to the vaginal angles.  Reperitonealization was accomplished using a running suture.  Sponge,  needle, and instrument counts were said to be correct.  The remaining  endometriotic lesions were ablated.  All instruments were then removed from  the patient's abdominal cavity.  The anterior peritoneum was closed.  The  fascia and the subcutaneous layers were irrigated.  Hemostasis was adequate.  The fascia was closed using two running sutures of zero Vicryl up in the corners to the  midline.  The subcutaneous layer was irrigated.  The  subcutaneous layer was closed using a running suture.  The skin was  reapproximated using skin staples.  Sponge, needle, and instrument counts  were correct.  The estimated blood loss was 300 cc.  The patient was noted  to drain clear, yellow urine at the end of her procedure.  The subumbilical  incision was closed using interrupted sutures of 3-0 Vicryl.  The patient  was awakened from her anesthetic and taken to the recovery  room in stable  condition.                                                Janine Limbo, M.D.    AVS/MEDQ  D:  01/17/2002  T:  01/17/2002  Job:  [id number]   cc:   Pam Drown, M.D.  [address]  [place]  [place] [address]  Fax: [phone]

## 2010-05-27 NOTE — Op Note (Signed)
   NAME:  MARLENNE, RIDGE                        ACCOUNT NO.:  [id number]   MEDICAL RECORD NO.:  [id number]                   PATIENT TYPE:  OBV   LOCATION:  9399                                 [facility]   PHYSICIAN:  Janine Limbo, M.D.            DATE OF BIRTH:  1960/05/12   DATE OF PROCEDURE:  01/17/2002  DATE OF DISCHARGE:                                 OPERATIVE REPORT   No dictation for this job.                                               Janine Limbo, M.D.    AVS/MEDQ  D:  01/17/2002  T:  01/17/2002  Job:  [id number]   cc:   Dario Guardian, M.D.  [address]  [place]  [place] [address]  Fax: [phone]

## 2011-03-27 ENCOUNTER — Ambulatory Visit (INDEPENDENT_AMBULATORY_CARE_PROVIDER_SITE_OTHER): Payer: Self-pay | Admitting: General Surgery

## 2011-06-11 ENCOUNTER — Other Ambulatory Visit: Payer: Self-pay | Admitting: Family Medicine

## 2011-07-15 ENCOUNTER — Ambulatory Visit (INDEPENDENT_AMBULATORY_CARE_PROVIDER_SITE_OTHER): Payer: Commercial Indemnity | Admitting: Obstetrics and Gynecology

## 2011-07-15 ENCOUNTER — Encounter: Payer: Self-pay | Admitting: Obstetrics and Gynecology

## 2011-07-15 VITALS — BP 110/80 | Ht 69.0 in | Wt 222.0 lb

## 2011-07-15 DIAGNOSIS — F329 Major depressive disorder, single episode, unspecified: Secondary | ICD-10-CM

## 2011-07-15 DIAGNOSIS — I10 Essential (primary) hypertension: Secondary | ICD-10-CM

## 2011-07-15 DIAGNOSIS — E139 Other specified diabetes mellitus without complications: Secondary | ICD-10-CM

## 2011-07-15 DIAGNOSIS — E049 Nontoxic goiter, unspecified: Secondary | ICD-10-CM

## 2011-07-15 DIAGNOSIS — E119 Type 2 diabetes mellitus without complications: Secondary | ICD-10-CM

## 2011-07-15 DIAGNOSIS — D071 Carcinoma in situ of vulva: Secondary | ICD-10-CM

## 2011-07-15 DIAGNOSIS — E785 Hyperlipidemia, unspecified: Secondary | ICD-10-CM

## 2011-07-15 DIAGNOSIS — Z90710 Acquired absence of both cervix and uterus: Secondary | ICD-10-CM

## 2011-07-15 DIAGNOSIS — C519 Malignant neoplasm of vulva, unspecified: Secondary | ICD-10-CM

## 2011-07-15 NOTE — Progress Notes (Signed)
HISTORY OF PRESENT ILLNESS  Ann Mills is a 51 y.o. year old female,No obstetric history on file., who presents for a problem visit. The patient was seen by her family physician who noted a wart-like lesion on her vulva. 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 She had a hysterectomy in 2000.  Obstetrical history: Gravida 0  Past medical history:  The patient has a history of sickle cell trait.  She has hypertension, diabetes, hyperlipidemia, asthma, goiter, and depression.  She had gastric surgery in 2010.  Drug allergies:  No known drug allergies.  The patient is allergic to eggs products.  Social history:  The patient drinks alcohol socially.  She denies cigarette use and other recreational drug usage.  Review of systems the patient has visual and dental problems.  Family history the patient has a family history of sickle cell trait, heart disease, strokes, and joint problems.  Subjective:  The patient has occasional itching at her vulva but no other symptoms at all.  Objective:  BP 110/80  Ht 5\' 9"  (1.753 m)  Wt 222 lb (100.699 kg)  BMI 32.78 kg/m2   General: alert and no distress Resp: clear to auscultation bilaterally Cardio: regular rate and rhythm, S1, S2 normal, no murmur, click, rub or gallop GI: soft, non-tender; bowel sounds normal; no masses,  no organomegaly  External genitalia: there is a 2 cm raised cluster of wart lesions to the right of the labia majora.  The area is nontender.  The area is well healed. Vaginal: normal without tenderness, induration or masses Cervix: absent Adnexa: normal bimanual exam Uterus: absent  Assessment:  Carcinoma in situ of the vulva Obesity Hypertension Diabetes Depression goiter Hyperlipidemia  Plan:  The patient will need a wide local excision of the wart-like lesion. I do not feel that a skin graft is needed.  The risk and benefits of the procedure were reviewed.  We will schedule.  Return  to office prn if needed before surgery.   Leonard Schwartz M.D.  07/15/2011 10:05 AM

## 2011-07-16 DIAGNOSIS — Z90710 Acquired absence of both cervix and uterus: Secondary | ICD-10-CM | POA: Insufficient documentation

## 2011-07-16 DIAGNOSIS — F329 Major depressive disorder, single episode, unspecified: Secondary | ICD-10-CM | POA: Insufficient documentation

## 2011-07-16 DIAGNOSIS — E139 Other specified diabetes mellitus without complications: Secondary | ICD-10-CM | POA: Insufficient documentation

## 2011-07-16 DIAGNOSIS — E049 Nontoxic goiter, unspecified: Secondary | ICD-10-CM | POA: Insufficient documentation

## 2011-07-16 DIAGNOSIS — D071 Carcinoma in situ of vulva: Secondary | ICD-10-CM | POA: Insufficient documentation

## 2011-07-16 DIAGNOSIS — E785 Hyperlipidemia, unspecified: Secondary | ICD-10-CM | POA: Insufficient documentation

## 2011-07-16 DIAGNOSIS — I10 Essential (primary) hypertension: Secondary | ICD-10-CM | POA: Insufficient documentation

## 2011-07-17 ENCOUNTER — Telehealth: Payer: Self-pay | Admitting: Obstetrics and Gynecology

## 2011-07-17 NOTE — Telephone Encounter (Signed)
Wide local excision of the vulva scheduled for 07/31/11 @ 12:45 with AVS.  [employer] effective 01/06/00. Plan pays 75/25 after a $1,000 deductible.  Pre-op due $38.07. -Adrianne Pridgen

## 2011-07-21 ENCOUNTER — Other Ambulatory Visit: Payer: Self-pay | Admitting: Obstetrics and Gynecology

## 2011-07-23 ENCOUNTER — Encounter (HOSPITAL_COMMUNITY): Payer: Self-pay | Admitting: Pharmacist

## 2011-07-28 ENCOUNTER — Other Ambulatory Visit (HOSPITAL_COMMUNITY): Payer: Self-pay

## 2011-07-28 ENCOUNTER — Other Ambulatory Visit: Payer: Self-pay

## 2011-07-28 ENCOUNTER — Telehealth: Payer: Self-pay | Admitting: Obstetrics and Gynecology

## 2011-07-28 ENCOUNTER — Encounter (HOSPITAL_COMMUNITY)
Admission: RE | Admit: 2011-07-28 | Discharge: 2011-07-28 | Disposition: A | Discharge status: Home / Self Care | Payer: Managed Care, Other (non HMO) | Source: Ambulatory Visit | Attending: Obstetrics and Gynecology | Admitting: Obstetrics and Gynecology

## 2011-07-28 ENCOUNTER — Encounter (HOSPITAL_COMMUNITY): Payer: Self-pay

## 2011-07-28 DIAGNOSIS — Z01812 Encounter for preprocedural laboratory examination: Secondary | ICD-10-CM | POA: Insufficient documentation

## 2011-07-28 DIAGNOSIS — Z01818 Encounter for other preprocedural examination: Secondary | ICD-10-CM | POA: Insufficient documentation

## 2011-07-28 HISTORY — DX: Hypothyroidism, unspecified: E03.9

## 2011-07-28 HISTORY — DX: Unspecified asthma, uncomplicated: J45.909

## 2011-07-28 LAB — CBC
MCH: 27.6 pg (ref 26.0–34.0)
MCV: 85.5 fL (ref 78.0–100.0)
Platelets: 220 10*3/uL (ref 150–400)
RDW: 13.8 % (ref 11.5–15.5)

## 2011-07-28 LAB — BASIC METABOLIC PANEL
Calcium: 9.9 mg/dL (ref 8.4–10.5)
Creatinine, Ser: 0.9 mg/dL (ref 0.50–1.10)
GFR calc Af Amer: 86 mL/min — ABNORMAL LOW (ref >90)
GFR calc non Af Amer: 74 mL/min — ABNORMAL LOW (ref >90)
Sodium: 142 mEq/L (ref 135–145)

## 2011-07-28 NOTE — Patient Instructions (Addendum)
YOUR PROCEDURE IS SCHEDULED ON:07/31/11  ENTER THROUGH THE MAIN ENTRANCE OF [facility] AT:11:15am USE DESK PHONE AND DIAL [phone] TO INFORM us OF YOUR ARRIVAL  CALL [phone] IF YOU HAVE ANY QUESTIONS OR PROBLEMS PRIOR TO YOUR ARRIVAL.  REMEMBER: DO NOT EAT AFTER MIDNIGHT :THURSDAY   SPECIAL INSTRUCTIONS:WATER OK UNTIL 0830 AM ON FRIDAY   YOU MAY BRUSH YOUR TEETH THE MORNING OF SURGERY   TAKE THESE MEDICINES THE DAY OF SURGERY WITH SIP OF WATER: BP PILL  BRING INHALER TO HOSPITAL  DO NOT WEAR JEWELRY, EYE MAKEUP, LIPSTICK OR DARK FINGERNAIL POLISH DO NOT WEAR LOTIONS  DO NOT SHAVE FOR 48 HOURS PRIOR TO SURGERY  YOU WILL NOT BE ALLOWED TO DRIVE YOURSELF HOME.

## 2011-07-28 NOTE — Telephone Encounter (Signed)
Wide local excision of the vulva scheduled for 08/28/11 @ 10:30 with AVS.  [employer] effective 01/06/00. Plan pays 75/25 after a $1,000 deductible.  Pre-op due $38.07. -Adrianne Pridgen

## 2011-07-31 ENCOUNTER — Encounter: Payer: Self-pay | Admitting: Obstetrics and Gynecology

## 2011-08-17 ENCOUNTER — Encounter: Payer: Commercial Indemnity | Admitting: Obstetrics and Gynecology

## 2011-08-27 NOTE — H&P (Signed)
HISTORY OF PRESENT ILLNESS  [name] is a 51 y.o. year old female,No obstetric history on file., who presents for a problem visit. The patient was seen by her family physician who noted a wart-like lesion on her vulva. 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 She had a hysterectomy in 2000.   Obstetrical history: Gravida 0   Past medical history:   The patient has a history of sickle cell trait. She has hypertension, diabetes, hyperlipidemia, asthma, goiter, and depression. She had gastric surgery in 2010.   Drug allergies:   No known drug allergies. The patient is allergic to eggs products.   Social history:   The patient drinks alcohol socially. She denies cigarette use and other recreational drug usage.   Review of systems: the patient has visual and dental problems.   Family history: the patient has a family history of sickle cell trait, heart disease, strokes, and joint problems.   Subjective:   The patient has occasional itching at her vulva but no other symptoms at all.   Objective:   BP 110/80  Ht 5\' 9"  (1.753 m)  Wt 222 lb (100.699 kg)  BMI 32.78 kg/m2   General: alert and no distress  Resp: clear to auscultation bilaterally  Cardio: regular rate and rhythm, S1, S2 normal, no murmur, click, rub or gallop  GI: soft, non-tender; bowel sounds normal; no masses, no organomegaly  External genitalia: there is a 2 cm raised cluster of wart lesions to the right of the labia majora. The area is nontender. The area is well healed.  Vaginal: normal without tenderness, induration or masses  Cervix: absent  Adnexa: normal bimanual exam  Uterus: absent   Assessment:   Carcinoma in situ of the vulva  Obesity  Hypertension  Diabetes  Depression  goiter  Hyperlipidemia   Plan:   The patient will need a wide local excision of the wart-like lesion. I do not feel that a skin graft is needed. The risk and benefits of the procedure were  reviewed.  Mylinda Latina.D.

## 2011-08-28 ENCOUNTER — Encounter (HOSPITAL_COMMUNITY): Payer: Self-pay | Admitting: Anesthesiology

## 2011-08-28 ENCOUNTER — Encounter (HOSPITAL_COMMUNITY)
Admission: RE | Disposition: A | Discharge status: Home / Self Care | Payer: Self-pay | Source: Ambulatory Visit | Attending: Obstetrics and Gynecology

## 2011-08-28 ENCOUNTER — Ambulatory Visit (HOSPITAL_COMMUNITY): Payer: Managed Care, Other (non HMO) | Admitting: Anesthesiology

## 2011-08-28 ENCOUNTER — Ambulatory Visit (HOSPITAL_COMMUNITY)
Admission: RE | Admit: 2011-08-28 | Discharge: 2011-08-28 | Disposition: A | Discharge status: Home / Self Care | Payer: Managed Care, Other (non HMO) | Source: Ambulatory Visit | Attending: Obstetrics and Gynecology | Admitting: Obstetrics and Gynecology

## 2011-08-28 DIAGNOSIS — I10 Essential (primary) hypertension: Secondary | ICD-10-CM | POA: Insufficient documentation

## 2011-08-28 DIAGNOSIS — Z01812 Encounter for preprocedural laboratory examination: Secondary | ICD-10-CM | POA: Insufficient documentation

## 2011-08-28 DIAGNOSIS — E119 Type 2 diabetes mellitus without complications: Secondary | ICD-10-CM | POA: Insufficient documentation

## 2011-08-28 DIAGNOSIS — D071 Carcinoma in situ of vulva: Secondary | ICD-10-CM | POA: Insufficient documentation

## 2011-08-28 DIAGNOSIS — Z01818 Encounter for other preprocedural examination: Secondary | ICD-10-CM | POA: Insufficient documentation

## 2011-08-28 DIAGNOSIS — D069 Carcinoma in situ of cervix, unspecified: Secondary | ICD-10-CM

## 2011-08-28 HISTORY — PX: VULVAR LESION REMOVAL: SHX5391

## 2011-08-28 LAB — GLUCOSE, CAPILLARY: Glucose-Capillary: 95 mg/dL (ref 70–99)

## 2011-08-28 SURGERY — VULVAR LESION
Anesthesia: General | Site: Vagina | Wound class: Clean Contaminated

## 2011-08-28 MED ORDER — FENTANYL CITRATE 0.05 MG/ML IJ SOLN
INTRAMUSCULAR | Status: DC | PRN
  Administered 2011-08-28: 50 ug via INTRAVENOUS

## 2011-08-28 MED ORDER — KETOROLAC TROMETHAMINE 60 MG/2ML IM SOLN
INTRAMUSCULAR | Status: AC
  Filled 2011-08-28: qty 2

## 2011-08-28 MED ORDER — FENTANYL CITRATE 0.05 MG/ML IJ SOLN
INTRAMUSCULAR | Status: AC
  Filled 2011-08-28: qty 2

## 2011-08-28 MED ORDER — KETOROLAC TROMETHAMINE 30 MG/ML IJ SOLN
INTRAMUSCULAR | Status: DC | PRN
  Administered 2011-08-28: 30 mg via INTRAVENOUS

## 2011-08-28 MED ORDER — 0.9 % SODIUM CHLORIDE (POUR BTL) OPTIME
TOPICAL | Status: DC | PRN
  Administered 2011-08-28: 1000 mL

## 2011-08-28 MED ORDER — IBUPROFEN 800 MG PO TABS: 800.0000 mg | ORAL_TABLET | Freq: Three times a day (TID) | ORAL | Status: AC | PRN

## 2011-08-28 MED ORDER — KETOROLAC TROMETHAMINE 30 MG/ML IJ SOLN: 15.0000 mg | Freq: Once | INTRAMUSCULAR | Status: DC | PRN

## 2011-08-28 MED ORDER — FENTANYL CITRATE 0.05 MG/ML IJ SOLN: 25.0000 ug | INTRAMUSCULAR | Status: DC | PRN

## 2011-08-28 MED ORDER — MIDAZOLAM HCL 2 MG/2ML IJ SOLN
INTRAMUSCULAR | Status: AC
  Filled 2011-08-28: qty 2

## 2011-08-28 MED ORDER — ACETIC ACID 4% SOLUTION
Status: DC | PRN
  Administered 2011-08-28: 1 via TOPICAL

## 2011-08-28 MED ORDER — KETOROLAC TROMETHAMINE 60 MG/2ML IM SOLN
INTRAMUSCULAR | Status: DC | PRN
  Administered 2011-08-28: 30 mg via INTRAMUSCULAR

## 2011-08-28 MED ORDER — LIDOCAINE IN DEXTROSE 5-7.5 % IV SOLN
INTRAVENOUS | Status: AC
  Filled 2011-08-28: qty 2

## 2011-08-28 MED ORDER — BUPIVACAINE-EPINEPHRINE 0.5% -1:200000 IJ SOLN
INTRAMUSCULAR | Status: DC | PRN
  Administered 2011-08-28: 17 mL

## 2011-08-28 MED ORDER — OXYCODONE-ACETAMINOPHEN 5-325 MG PO TABS: 1.0000 | ORAL_TABLET | ORAL | Status: AC | PRN

## 2011-08-28 MED ORDER — BUPIVACAINE-EPINEPHRINE (PF) 0.5% -1:200000 IJ SOLN
INTRAMUSCULAR | Status: AC
  Filled 2011-08-28: qty 10

## 2011-08-28 MED ORDER — MIDAZOLAM HCL 5 MG/5ML IJ SOLN
INTRAMUSCULAR | Status: DC | PRN
  Administered 2011-08-28 (×2): 2 mg via INTRAVENOUS

## 2011-08-28 MED ORDER — LACTATED RINGERS IV SOLN
INTRAVENOUS | Status: DC
  Administered 2011-08-28 (×2): via INTRAVENOUS

## 2011-08-28 SURGICAL SUPPLY — 37 items
ADH SKN CLS APL DERMABOND .7 (GAUZE/BANDAGES/DRESSINGS) ×1
BLADE SURG 15 STRL LF C SS BP (BLADE) ×1 IMPLANT
BLADE SURG 15 STRL SS (BLADE) ×2
CATH SILICONE 16FRX5CC (CATHETERS) ×1 IMPLANT
CLOTH BEACON ORANGE TIMEOUT ST (SAFETY) ×2 IMPLANT
CONTAINER PREFILL 10% NBF 15ML (MISCELLANEOUS) ×2 IMPLANT
COUNTER NEEDLE 1200 MAGNETIC (NEEDLE) IMPLANT
DERMABOND ADVANCED (GAUZE/BANDAGES/DRESSINGS) ×1
DERMABOND ADVANCED .7 DNX12 (GAUZE/BANDAGES/DRESSINGS) IMPLANT
ELECT REM PT RETURN 9FT ADLT (ELECTROSURGICAL) ×2
ELECTRODE REM PT RTRN 9FT ADLT (ELECTROSURGICAL) IMPLANT
EVACUATOR PREFILTER SMOKE (MISCELLANEOUS) IMPLANT
GLOVE BIOGEL PI IND STRL 7.5 (GLOVE) IMPLANT
GLOVE BIOGEL PI IND STRL 8.5 (GLOVE) ×1 IMPLANT
GLOVE BIOGEL PI INDICATOR 7.5 (GLOVE) ×2
GLOVE BIOGEL PI INDICATOR 8.5 (GLOVE) ×1
GLOVE ECLIPSE 8.0 STRL XLNG CF (GLOVE) ×2 IMPLANT
GLOVE SURG SS PI 8.0 STRL IVOR (GLOVE) ×2 IMPLANT
GOWN BRE IMP SLV AUR XL STRL (GOWN DISPOSABLE) ×1 IMPLANT
GOWN PREVENTION PLUS LG XLONG (DISPOSABLE) ×2 IMPLANT
HOSE NS SMOKE EVAC 7/8 X6 (MISCELLANEOUS) IMPLANT
NDL SPNL 22GX3.5 QUINCKE BK (NEEDLE) ×1 IMPLANT
NEEDLE SPNL 22GX3.5 QUINCKE BK (NEEDLE) ×2 IMPLANT
NS IRRIG 1000ML POUR BTL (IV SOLUTION) ×2 IMPLANT
PACK VAGINAL MINOR WOMEN LF (CUSTOM PROCEDURE TRAY) ×1 IMPLANT
PACK VAGINAL WOMENS (CUSTOM PROCEDURE TRAY) ×1 IMPLANT
PAD PREP 24X48 CUFFED NSTRL (MISCELLANEOUS) ×2 IMPLANT
PENCIL BUTTON HOLSTER BLD 10FT (ELECTRODE) IMPLANT
REDUCER FITTING SMOKE EVAC (MISCELLANEOUS) IMPLANT
SCOPETTES 8  STERILE (MISCELLANEOUS) ×2
SCOPETTES 8 STERILE (MISCELLANEOUS) ×2 IMPLANT
SUT CHROMIC 2 0 SH (SUTURE) ×1 IMPLANT
SUT MNCRL AB 3-0 PS2 27 (SUTURE) ×1 IMPLANT
SUT VICRYL 4-0 PS2 18IN ABS (SUTURE) ×1 IMPLANT
SYR CONTROL 10ML LL (SYRINGE) ×2 IMPLANT
TOWEL OR 17X24 6PK STRL BLUE (TOWEL DISPOSABLE) ×4 IMPLANT
WATER STERILE IRR 1000ML POUR (IV SOLUTION) ×1 IMPLANT

## 2011-08-28 NOTE — Anesthesia Postprocedure Evaluation (Signed)
  Anesthesia Post-op Note  Patient: Ann Mills  Procedure(s) Performed: Procedure(s) (LRB): VULVAR LESION (N/A)  Patient is awake, responsive, moving her legs, and has signs of resolution of her numbness. Pain and nausea are reasonably well controlled. Vital signs are stable and clinically acceptable. Oxygen saturation is clinically acceptable. There are no apparent anesthetic complications at this time. Patient is ready for discharge.  

## 2011-08-28 NOTE — Anesthesia Preprocedure Evaluation (Addendum)
Anesthesia Evaluation  Patient identified by MRN, date of birth, ID band Patient awake    Reviewed: Allergy & Precautions, H&P , NPO status , Patient's Chart, lab work & pertinent test results, reviewed documented beta blocker date and time   History of Anesthesia Complications Negative for: history of anesthetic complications  Airway Mallampati: I TM Distance: >3 FB Neck ROM: full    Dental  (+) Teeth Intact   Pulmonary asthma (allergy related (airborne irritants/smells) - last used 2 months ago) ,  breath sounds clear to auscultation  Pulmonary exam normal       Cardiovascular Exercise Tolerance: Good hypertension (BP 146/92, did not take BP med today), On Medications Rhythm:regular Rate:Normal  hyperlipidemia   Neuro/Psych PSYCHIATRIC DISORDERS (depression) negative neurological ROS     GI/Hepatic negative GI ROS, Neg liver ROS,   Endo/Other  Well Controlled, Type 2, Oral Hypoglycemic Agents  Renal/GU negative Renal ROS  Female GU complaint     Musculoskeletal   Abdominal   Peds  Hematology negative hematology ROS (+)   Anesthesia Other Findings EGG ALLERGY - itching and facial swelling (can eat eggs, but egg-containing medicines cause this reaction) - avoid propofol  Reproductive/Obstetrics negative OB ROS                           Anesthesia Physical Anesthesia Plan  ASA: III  Anesthesia Plan: Spinal   Post-op Pain Management:    Induction:   Airway Management Planned:   Additional Equipment:   Intra-op Plan:   Post-operative Plan:   Informed Consent: I have reviewed the patients History and Physical, chart, labs and discussed the procedure including the risks, benefits and alternatives for the proposed anesthesia with the patient or authorized representative who has indicated his/her understanding and acceptance.   Dental Advisory Given  Plan Discussed with: CRNA and  Surgeon  Anesthesia Plan Comments: (Lab work confirmed with CRNA in room. Platelets okay. Discussed spinal anesthetic, and patient consents to the procedure:  included risk of possible headache,backache, failed block, allergic reaction, and nerve injury. This patient was asked if she had any questions or concerns before the procedure started. )       Anesthesia Quick Evaluation

## 2011-08-28 NOTE — Anesthesia Postprocedure Evaluation (Signed)
  Anesthesia Post-op Note  Patient: Ann Mills  Procedure(s) Performed: Procedure(s) (LRB): VULVAR LESION (N/A)  Patient is awake, responsive, moving her legs, and has signs of resolution of her numbness. Pain and nausea are reasonably well controlled. Vital signs are stable and clinically acceptable. Oxygen saturation is clinically acceptable. There are no apparent anesthetic complications at this time. Patient is ready for discharge.

## 2011-08-28 NOTE — Transfer of Care (Signed)
Immediate Anesthesia Transfer of Care Note  Patient: Ann Mills  Procedure(s) Performed: Procedure(s) (LRB): VULVAR LESION (N/A)  Patient Location: PACU  Anesthesia Type: Regional  Level of Consciousness: awake, alert  and oriented  Airway & Oxygen Therapy: Patient Spontanous Breathing  Post-op Assessment: Report given to PACU RN and Post -op Vital signs reviewed and stable  Post vital signs: Reviewed and stable  Complications: No apparent anesthesia complications

## 2011-08-28 NOTE — Op Note (Signed)
OPERATIVE NOTE  Ann Mills  DOB:    March 12, 1960  MRN:    [id number]  CSN:    [id number]  Date of Surgery:  08/28/2011  Preoperative Diagnosis:  Carcinoma in situ of a skin lesion on the right lower perineum  Postoperative Diagnosis:  Carcinoma in situ of a skin lesion on the right lower perineum  Raised lesion on the left lower perineum  Procedure:  Wild local excision of skin lesion with carcinoma in situ on the right lower perineum  Biopsy of lesion on left lower perineum  Surgeon:  Leonard Schwartz, M.D.  Assistant:  None  Anesthetic:  Spinal  Disposition:  The patient had a biopsy of a skin lesion on the right lower perineum that showed carcinoma in situ. She understands the indications for today's procedure. She understands the alternative treatment options. She accepts the risk of, but not limited to, anesthetic complications, bleeding, infections, and possible damage to the surrounding organs.  Findings:  The patient had a raised wart like lesion on the right lower perineum. There is a raised hyperpigmented circular lesion measuring less than 0.3 cm on the left lower perineum.  Procedure:  The patient was taken to the operating room where a spinal anesthetic was given. The lower abdomen, perineum, and vagina were prepped with multiple layers of prep solution. The bladder was drained of urine. Examination under anesthesia was performed. The patient was sterilely draped. The wartlike lesion on the right lower perineum was marked removing all areas that were suspicious for pathology. The area was then injected with 15 cc of half percent Marcaine with epinephrine. An elliptical incision was made removing all abnormal-appearing areas. The skin was removed with a scalpel and the specimen was marked at 12:00. Hemostasis was achieved using electrocautery. The lesion was closed using 2-0 Vicryl to take all pressure off of the skin. The skin was closed using a  subcuticular suture of 3-0 Monocryl. Hemostasis was adequate. The lesion on the left lower perineum was injected with 2 cc of half percent Marcaine with epinephrine. The lesion was sharply removed. No sutures were required. Hemostasis was adequate. Dermabond was placed over both incisions. Sponge and needle counts were correct on 2 occasions. The estimated blood loss was 2 cc. The patient tolerated her procedure well. She was returned to the supine position and and transported to the recovery room in stable condition. She tolerated her procedure well. The skin lesion from the right lower perineum and the biopsy from the left lower perineum were both sent to pathology for evaluation.  Followup instructions:  The patient will return to see Dr. Stefano Gaul in 2 weeks. She will call for questions or concerns.She will sit in a tub of hot water each day. She will blow dry her perineum. She will apply antibiotic ointment. She will wear cotton undergarments.  Discharge medications:  Motrin 800 mg every 8 hours as needed for mild to moderate pain. Percocet one or 2 tablets every 4 hours as needed for severe pain.  Leonard Schwartz, M.D.

## 2011-08-28 NOTE — H&P (Signed)
The patient was interviewed and examined today.  The previously documented history and physical examination was reviewed. There are no changes. The operative procedure was reviewed. The risks and benefits were outlined again. The specific risks include, but are not limited to, anesthetic complications, bleeding, infections, and possible damage to the surrounding organs. The patient's questions were answered.  We are ready to proceed as outlined. The likelihood of the patient achieving the goals of this procedure is very likely. Her neuro exam is grossly normal.  Leonard Schwartz, M.D.

## 2011-08-28 NOTE — Anesthesia Procedure Notes (Signed)
Spinal  Patient location during procedure: OR Preanesthetic Checklist Completed: patient identified, site marked, surgical consent, pre-op evaluation, timeout performed, IV checked, risks and benefits discussed and monitors and equipment checked Spinal Block Patient position: sitting Prep: DuraPrep Patient monitoring: heart rate, cardiac monitor, continuous pulse ox and blood pressure Approach: midline Location: L3-4 Injection technique: single-shot Needle Needle type: Sprotte  Needle gauge: 24 G Needle length: 9 cm Assessment Sensory level: T4 Additional Notes Spinal Dosage in OR  Xylocaine ml       1.2  5%

## 2011-08-31 ENCOUNTER — Encounter (HOSPITAL_COMMUNITY): Payer: Self-pay | Admitting: Obstetrics and Gynecology

## 2011-09-02 ENCOUNTER — Telehealth: Payer: Self-pay | Admitting: Obstetrics and Gynecology

## 2011-09-02 NOTE — Telephone Encounter (Signed)
Patient called.  No answer.  Message left.  Pap report showed CIN-3.  Negative margins.  No invasions. Dr. Stefano Gaul

## 2011-09-16 ENCOUNTER — Telehealth: Payer: Self-pay | Admitting: Obstetrics and Gynecology

## 2011-09-16 NOTE — Telephone Encounter (Signed)
Lm on vm to cb per telephone call.  

## 2011-09-17 NOTE — Telephone Encounter (Signed)
Returning pt's call. Lm on vm to cb.

## 2011-09-17 NOTE — Telephone Encounter (Signed)
Tc from pt. Informed pt of Dr. Su Hilt recs to follow up with PCP rgdg numbness of mouth. Pt voices understanding.

## 2011-09-17 NOTE — Telephone Encounter (Signed)
Tc to pt per recs rgdg prev telephone call. Consulted with Dr. Su Hilt, likely not related to surgery. Pt to follow up with PCP. Lm on vm to cb.

## 2011-09-17 NOTE — Telephone Encounter (Signed)
Tc from pt per telephone call. Pt c/o numbness of mouth(internally) and dry lips for approx 1 week after surgery for vulvar lesion. Pt states,"received an epidural". No fever. Pt has a "bland taste in mouth when eating foods and sometimes has burning of the mouth when drinking. No lesions or sores of the mouth noticed. Informed pt will consult with provider and cb with recs. Pt agrees.

## 2011-09-17 NOTE — Telephone Encounter (Signed)
Triage/post op issues

## 2011-09-21 ENCOUNTER — Ambulatory Visit (INDEPENDENT_AMBULATORY_CARE_PROVIDER_SITE_OTHER): Payer: Commercial Indemnity | Admitting: Obstetrics and Gynecology

## 2011-09-21 ENCOUNTER — Encounter: Payer: Self-pay | Admitting: Obstetrics and Gynecology

## 2011-09-21 VITALS — BP 142/82 | Temp 97.9°F | Ht 71.25 in | Wt 231.0 lb

## 2011-09-21 DIAGNOSIS — D071 Carcinoma in situ of vulva: Secondary | ICD-10-CM

## 2011-09-21 MED ORDER — NYSTATIN 100000 UNIT/GM EX POWD: Freq: Two times a day (BID) | CUTANEOUS | Status: AC

## 2011-09-21 NOTE — Progress Notes (Signed)
  HISTORY OF PRESENT ILLNESS  [name] is a 51 y.o. year old female,G0P0, who presents for a postoperative visit.  On August 28, 2011 the patient had a wide local vulvar excision of CIS of the right vulva.  She also had a biopsy of the left vulva.  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  The biopsy of the left vulva was benign.  Subjective:  The patient complains of moistness between her thighs that is irritating.  Objective:  BP 142/82  Temp 97.9 F (36.6 C) (Oral)  Ht 5' 11.25" (1.81 m)  Wt 231 lb (104.781 kg)  BMI 31.99 kg/m2   the incision has separated slightly but is granulating well.  There is no evidence of infection.  The entire perineum is moist from sweat.  the left vulvar biopsy site is completely healed.  Assessment:  CIS/ CIN III of the vulva.  Completely resected. Moist perineum.  Plan:  Hot bath each day. Blow dry.  Patient given a prescription for nystatin powder.  The granulation area should be completely healed in 7-10 days.  The patient should return to her primary physician or to see [place] every 6 months for 2 years simply to document that there is no evidence of recurrence.  She then can return to once a year gynecologic care.  Return to office prn if symptoms worsen or fail to improve.   Leonard Schwartz M.D.  09/21/2011 8:59 AM

## 2011-09-25 ENCOUNTER — Telehealth: Payer: Self-pay | Admitting: Obstetrics and Gynecology

## 2011-09-25 ENCOUNTER — Telehealth (INDEPENDENT_AMBULATORY_CARE_PROVIDER_SITE_OTHER): Payer: Self-pay | Admitting: General Surgery

## 2011-09-25 NOTE — Telephone Encounter (Signed)
Spoke with pt rgd msg pt states have rash in buttock are states its getting worse unable to go to work today pt wants a work note advised pt will consult with AVS pt voice understanding

## 2011-09-25 NOTE — Telephone Encounter (Signed)
I spoke with the patient via phone and advised that it is time for her annual bariatric surgery follow-up. Appointment scheduled with Dr Johna Sheriff for 10/08/11 @ 10:45 am per pt request..cef

## 2011-09-25 NOTE — Telephone Encounter (Signed)
Lm on vm tcb rgd msg 

## 2011-09-27 NOTE — Telephone Encounter (Signed)
Patient needs an appointment.  Dr. Stefano Gaul

## 2011-09-29 NOTE — Telephone Encounter (Signed)
Spoke with pt rgd msg informed per AVS need appt for out of work note pt states rash is better now she don not think she needs eval pt states she missed work on Friday due to painful rash want out of work not for 09/25/11 advised pt will consult with AVS pt voice understanding

## 2011-09-29 NOTE — Telephone Encounter (Signed)
Lm on vm tcb rgd msg 

## 2011-10-05 NOTE — Telephone Encounter (Signed)
Okay to provide note saying that the patient has a rash and the patient stated that she had a rash and could not work, but because I was unable to examine the patient at the time, I am unable to say that her rash and made it such that she could not work.  Dr. Stefano Gaul

## 2011-10-07 NOTE — Telephone Encounter (Signed)
Lm on vm tcb rgd previous call 

## 2011-10-08 ENCOUNTER — Ambulatory Visit (INDEPENDENT_AMBULATORY_CARE_PROVIDER_SITE_OTHER): Payer: Commercial Indemnity | Admitting: General Surgery

## 2011-10-08 VITALS — BP 110/72 | HR 66 | Temp 97.4°F | Resp 16 | Ht 71.0 in | Wt 220.2 lb

## 2011-10-08 DIAGNOSIS — Z09 Encounter for follow-up examination after completed treatment for conditions other than malignant neoplasm: Secondary | ICD-10-CM

## 2011-10-08 DIAGNOSIS — K912 Postsurgical malabsorption, not elsewhere classified: Secondary | ICD-10-CM

## 2011-10-08 DIAGNOSIS — Z9884 Bariatric surgery status: Secondary | ICD-10-CM

## 2011-10-08 NOTE — Progress Notes (Signed)
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 I have not seen her in over 2 years. She however is continued to do very well. She has had very good maintenance of her weight loss as below. Her only continuing issue his ongoing constipation although she remembers to take her fiber supplement daily she can manage this with occasional MiraLAX. She denies abdominal pain or vomiting or any other GI issues.  She had preoperative comorbidities of diabetes which is greatly improved now on just one medication, hypertension which is current and joint pain which is improved.  Past Medical History  Diagnosis Date  . Hypertension   . Carcinoma of vulva   . Anxiety   . Diabetes mellitus     AV. FBS=70-75  . Asthma   . Hypothyroidism     no meds  . LLQ pain   . Fibroids   . Anemia   . Menorrhagia   . Dysmenorrhea   . Obesity    Past Surgical History  Procedure Date  . Gastric bipass   . Roux-en-y gastric bypass   . Abdominal hysterectomy   . Vulvar lesion removal 08/28/2011    Procedure: VULVAR LESION;  Surgeon: Kirkland Hun, MD;  Location: [facility];  Service: Gynecology;  Laterality: N/A;     . Endometrial biopsy    Current Outpatient Prescriptions  Medication Sig Dispense Refill  . ACCU-CHEK COMFORT CURVE test strip       . albuterol (PROVENTIL HFA;VENTOLIN HFA) 108 (90 BASE) MCG/ACT inhaler Inhale 2 puffs into the lungs every 4 (four) hours as needed. For shortness of breath      . albuterol (PROVENTIL) (2.5 MG/3ML) 0.083% nebulizer solution Take 2.5 mg by nebulization every 6 (six) hours as needed.      Marland Kitchen aspirin 81 MG chewable tablet Chew 81 mg by mouth daily.      Marland Kitchen buPROPion (WELLBUTRIN XL) 300 MG 24 hr tablet Take 300 mg by mouth every morning.       . Calcium-Vitamin D (CALTRATE 600 PLUS-VIT D PO) Take 1 tablet by mouth daily.      . cholecalciferol (VITAMIN D) 1000 UNITS tablet  Take 1,000 Units by mouth daily.      Marland Kitchen ibuprofen (ADVIL,MOTRIN) 800 MG tablet       . nystatin (MYCOSTATIN) powder Apply topically 2 (two) times daily.  30 g  1  . oxyCODONE-acetaminophen (PERCOCET/ROXICET) 5-325 MG per tablet       . pioglitazone (ACTOS) 15 MG tablet Take 15 mg by mouth daily.      . ramipril (ALTACE) 10 MG capsule Take 10 mg by mouth daily.       . simvastatin (ZOCOR) 80 MG tablet Take 80 mg by mouth at bedtime.      Marland Kitchen zolpidem (AMBIEN) 10 MG tablet Take 10 mg by mouth at bedtime as needed.       Exam: BP 110/72  Pulse 66  Temp 97.4 F (36.3 C) (Temporal)  Resp 16  Ht 5\' 11"  (1.803 m)  Wt 220 lb 3.2 oz (99.882 kg)  BMI 30.71 kg/m2 Total weight loss 92 pounds  General: Appears well Skin: No rash or infection HEENT: No masses or adenopathy Lungs: Clear equal breath sounds Cardiovascular regular rate and rhythm. No edema Abdomen: Well-healed incision. No hernias. No tenderness or organomegaly. Extremities: No edema or joint swelling  Assessment and plan: Doing well following gastric bypass without complication  identified with good maintenance of her weight loss and marked improvement in diabetes, improve joint pain and current hypertension. We will check lab work including vitamin iron levels in color with the results. We discussed the importance of maintaining regular exercise. Return in one year or sooner if needed.

## 2011-10-08 NOTE — Patient Instructions (Signed)
Concentrate on getting back into a regular exercise program. We will call with your lab work.

## 2012-02-20 ENCOUNTER — Other Ambulatory Visit: Payer: Self-pay

## 2012-09-14 ENCOUNTER — Encounter (INDEPENDENT_AMBULATORY_CARE_PROVIDER_SITE_OTHER): Payer: Self-pay | Admitting: General Surgery

## 2012-11-10 ENCOUNTER — Other Ambulatory Visit: Payer: Self-pay

## 2013-10-20 ENCOUNTER — Other Ambulatory Visit: Payer: Self-pay

## 2016-09-10 ENCOUNTER — Encounter (HOSPITAL_COMMUNITY): Payer: Self-pay

## 2016-11-04 ENCOUNTER — Telehealth (HOSPITAL_COMMUNITY): Payer: Self-pay

## 2016-11-04 NOTE — Telephone Encounter (Signed)
This patient is overdue for recommended follow-up with a bariatric surgeon at [facility]. A letter was mailed to the address on file in September from both [place] in attempt to reestablish post-op care. Letter has been returned to [employer] marked undeliverable, unable to forward. No additional address on file in [facility] or [employer]. Information was shared with Mallory Shirk today at [place] so she may contact the patient via phone again in attempt to get the patient scheduled for an appointment in their office.

## 2017-09-02 DIAGNOSIS — E538 Deficiency of other specified B group vitamins: Secondary | ICD-10-CM | POA: Diagnosis not present

## 2017-09-02 DIAGNOSIS — R69 Illness, unspecified: Secondary | ICD-10-CM | POA: Diagnosis not present

## 2017-09-02 DIAGNOSIS — I10 Essential (primary) hypertension: Secondary | ICD-10-CM | POA: Diagnosis not present

## 2017-09-02 DIAGNOSIS — E559 Vitamin D deficiency, unspecified: Secondary | ICD-10-CM | POA: Diagnosis not present

## 2017-09-02 DIAGNOSIS — E119 Type 2 diabetes mellitus without complications: Secondary | ICD-10-CM | POA: Diagnosis not present

## 2017-09-02 DIAGNOSIS — E782 Mixed hyperlipidemia: Secondary | ICD-10-CM | POA: Diagnosis not present

## 2017-09-02 DIAGNOSIS — G479 Sleep disorder, unspecified: Secondary | ICD-10-CM | POA: Diagnosis not present

## 2017-09-02 DIAGNOSIS — K911 Postgastric surgery syndromes: Secondary | ICD-10-CM | POA: Diagnosis not present

## 2017-09-02 DIAGNOSIS — E1159 Type 2 diabetes mellitus with other circulatory complications: Secondary | ICD-10-CM | POA: Diagnosis not present

## 2018-03-31 DIAGNOSIS — E538 Deficiency of other specified B group vitamins: Secondary | ICD-10-CM | POA: Diagnosis not present

## 2018-03-31 DIAGNOSIS — M545 Low back pain: Secondary | ICD-10-CM | POA: Diagnosis not present

## 2018-03-31 DIAGNOSIS — G479 Sleep disorder, unspecified: Secondary | ICD-10-CM | POA: Diagnosis not present

## 2018-03-31 DIAGNOSIS — I10 Essential (primary) hypertension: Secondary | ICD-10-CM | POA: Diagnosis not present

## 2018-03-31 DIAGNOSIS — E1159 Type 2 diabetes mellitus with other circulatory complications: Secondary | ICD-10-CM | POA: Diagnosis not present

## 2018-03-31 DIAGNOSIS — R69 Illness, unspecified: Secondary | ICD-10-CM | POA: Diagnosis not present

## 2018-03-31 DIAGNOSIS — E782 Mixed hyperlipidemia: Secondary | ICD-10-CM | POA: Diagnosis not present

## 2018-03-31 DIAGNOSIS — E559 Vitamin D deficiency, unspecified: Secondary | ICD-10-CM | POA: Diagnosis not present

## 2019-04-28 DIAGNOSIS — J9801 Acute bronchospasm: Secondary | ICD-10-CM | POA: Diagnosis not present

## 2019-04-28 DIAGNOSIS — R69 Illness, unspecified: Secondary | ICD-10-CM | POA: Diagnosis not present

## 2019-04-28 DIAGNOSIS — E049 Nontoxic goiter, unspecified: Secondary | ICD-10-CM | POA: Diagnosis not present

## 2019-04-28 DIAGNOSIS — K911 Postgastric surgery syndromes: Secondary | ICD-10-CM | POA: Diagnosis not present

## 2019-04-28 DIAGNOSIS — E538 Deficiency of other specified B group vitamins: Secondary | ICD-10-CM | POA: Diagnosis not present

## 2019-04-28 DIAGNOSIS — E782 Mixed hyperlipidemia: Secondary | ICD-10-CM | POA: Diagnosis not present

## 2019-04-28 DIAGNOSIS — G479 Sleep disorder, unspecified: Secondary | ICD-10-CM | POA: Diagnosis not present

## 2019-04-28 DIAGNOSIS — I10 Essential (primary) hypertension: Secondary | ICD-10-CM | POA: Diagnosis not present

## 2019-04-28 DIAGNOSIS — E1159 Type 2 diabetes mellitus with other circulatory complications: Secondary | ICD-10-CM | POA: Diagnosis not present

## 2019-04-28 DIAGNOSIS — E559 Vitamin D deficiency, unspecified: Secondary | ICD-10-CM | POA: Diagnosis not present

## 2019-05-05 DIAGNOSIS — E782 Mixed hyperlipidemia: Secondary | ICD-10-CM | POA: Diagnosis not present

## 2019-05-05 DIAGNOSIS — Z7984 Long term (current) use of oral hypoglycemic drugs: Secondary | ICD-10-CM | POA: Diagnosis not present

## 2019-05-05 DIAGNOSIS — E538 Deficiency of other specified B group vitamins: Secondary | ICD-10-CM | POA: Diagnosis not present

## 2019-05-05 DIAGNOSIS — E559 Vitamin D deficiency, unspecified: Secondary | ICD-10-CM | POA: Diagnosis not present

## 2019-05-05 DIAGNOSIS — E1159 Type 2 diabetes mellitus with other circulatory complications: Secondary | ICD-10-CM | POA: Diagnosis not present

## 2019-11-17 DIAGNOSIS — G479 Sleep disorder, unspecified: Secondary | ICD-10-CM | POA: Diagnosis not present

## 2019-11-17 DIAGNOSIS — J9801 Acute bronchospasm: Secondary | ICD-10-CM | POA: Diagnosis not present

## 2019-11-17 DIAGNOSIS — I10 Essential (primary) hypertension: Secondary | ICD-10-CM | POA: Diagnosis not present

## 2019-11-17 DIAGNOSIS — Z0000 Encounter for general adult medical examination without abnormal findings: Secondary | ICD-10-CM | POA: Diagnosis not present

## 2019-11-17 DIAGNOSIS — E538 Deficiency of other specified B group vitamins: Secondary | ICD-10-CM | POA: Diagnosis not present

## 2019-11-17 DIAGNOSIS — E1159 Type 2 diabetes mellitus with other circulatory complications: Secondary | ICD-10-CM | POA: Diagnosis not present

## 2019-11-17 DIAGNOSIS — R69 Illness, unspecified: Secondary | ICD-10-CM | POA: Diagnosis not present

## 2019-11-17 DIAGNOSIS — E782 Mixed hyperlipidemia: Secondary | ICD-10-CM | POA: Diagnosis not present

## 2019-11-17 DIAGNOSIS — E559 Vitamin D deficiency, unspecified: Secondary | ICD-10-CM | POA: Diagnosis not present

## 2020-05-17 DIAGNOSIS — Z7984 Long term (current) use of oral hypoglycemic drugs: Secondary | ICD-10-CM | POA: Diagnosis not present

## 2020-05-17 DIAGNOSIS — R69 Illness, unspecified: Secondary | ICD-10-CM | POA: Diagnosis not present

## 2020-05-17 DIAGNOSIS — I10 Essential (primary) hypertension: Secondary | ICD-10-CM | POA: Diagnosis not present

## 2020-05-17 DIAGNOSIS — Z0000 Encounter for general adult medical examination without abnormal findings: Secondary | ICD-10-CM | POA: Diagnosis not present

## 2020-05-17 DIAGNOSIS — E559 Vitamin D deficiency, unspecified: Secondary | ICD-10-CM | POA: Diagnosis not present

## 2020-05-17 DIAGNOSIS — J9801 Acute bronchospasm: Secondary | ICD-10-CM | POA: Diagnosis not present

## 2020-05-17 DIAGNOSIS — E782 Mixed hyperlipidemia: Secondary | ICD-10-CM | POA: Diagnosis not present

## 2020-05-17 DIAGNOSIS — E1159 Type 2 diabetes mellitus with other circulatory complications: Secondary | ICD-10-CM | POA: Diagnosis not present

## 2020-05-17 DIAGNOSIS — E538 Deficiency of other specified B group vitamins: Secondary | ICD-10-CM | POA: Diagnosis not present

## 2020-05-17 DIAGNOSIS — G479 Sleep disorder, unspecified: Secondary | ICD-10-CM | POA: Diagnosis not present

## 2020-05-24 DIAGNOSIS — E1159 Type 2 diabetes mellitus with other circulatory complications: Secondary | ICD-10-CM | POA: Diagnosis not present

## 2020-05-24 DIAGNOSIS — E782 Mixed hyperlipidemia: Secondary | ICD-10-CM | POA: Diagnosis not present

## 2020-05-24 DIAGNOSIS — R69 Illness, unspecified: Secondary | ICD-10-CM | POA: Diagnosis not present

## 2020-05-24 DIAGNOSIS — E538 Deficiency of other specified B group vitamins: Secondary | ICD-10-CM | POA: Diagnosis not present

## 2020-05-24 DIAGNOSIS — J301 Allergic rhinitis due to pollen: Secondary | ICD-10-CM | POA: Diagnosis not present

## 2020-05-24 DIAGNOSIS — E559 Vitamin D deficiency, unspecified: Secondary | ICD-10-CM | POA: Diagnosis not present

## 2020-05-24 DIAGNOSIS — I10 Essential (primary) hypertension: Secondary | ICD-10-CM | POA: Diagnosis not present

## 2020-05-24 DIAGNOSIS — G479 Sleep disorder, unspecified: Secondary | ICD-10-CM | POA: Diagnosis not present

## 2020-05-24 DIAGNOSIS — J9801 Acute bronchospasm: Secondary | ICD-10-CM | POA: Diagnosis not present

## 2020-11-22 DIAGNOSIS — E782 Mixed hyperlipidemia: Secondary | ICD-10-CM | POA: Diagnosis not present

## 2020-11-22 DIAGNOSIS — G479 Sleep disorder, unspecified: Secondary | ICD-10-CM | POA: Diagnosis not present

## 2020-11-22 DIAGNOSIS — Z23 Encounter for immunization: Secondary | ICD-10-CM | POA: Diagnosis not present

## 2020-11-22 DIAGNOSIS — E119 Type 2 diabetes mellitus without complications: Secondary | ICD-10-CM | POA: Diagnosis not present

## 2020-11-22 DIAGNOSIS — J301 Allergic rhinitis due to pollen: Secondary | ICD-10-CM | POA: Diagnosis not present

## 2020-11-22 DIAGNOSIS — J45909 Unspecified asthma, uncomplicated: Secondary | ICD-10-CM | POA: Diagnosis not present

## 2020-11-22 DIAGNOSIS — E538 Deficiency of other specified B group vitamins: Secondary | ICD-10-CM | POA: Diagnosis not present

## 2020-11-22 DIAGNOSIS — I10 Essential (primary) hypertension: Secondary | ICD-10-CM | POA: Diagnosis not present

## 2020-11-22 DIAGNOSIS — E1159 Type 2 diabetes mellitus with other circulatory complications: Secondary | ICD-10-CM | POA: Diagnosis not present

## 2020-11-22 DIAGNOSIS — Z0000 Encounter for general adult medical examination without abnormal findings: Secondary | ICD-10-CM | POA: Diagnosis not present

## 2020-11-22 DIAGNOSIS — R69 Illness, unspecified: Secondary | ICD-10-CM | POA: Diagnosis not present

## 2021-03-18 DIAGNOSIS — Z1231 Encounter for screening mammogram for malignant neoplasm of breast: Secondary | ICD-10-CM | POA: Diagnosis not present

## 2021-05-16 DIAGNOSIS — E1159 Type 2 diabetes mellitus with other circulatory complications: Secondary | ICD-10-CM | POA: Diagnosis not present

## 2021-05-16 DIAGNOSIS — E782 Mixed hyperlipidemia: Secondary | ICD-10-CM | POA: Diagnosis not present

## 2021-05-16 DIAGNOSIS — E538 Deficiency of other specified B group vitamins: Secondary | ICD-10-CM | POA: Diagnosis not present

## 2021-05-16 DIAGNOSIS — E559 Vitamin D deficiency, unspecified: Secondary | ICD-10-CM | POA: Diagnosis not present

## 2021-05-23 DIAGNOSIS — J301 Allergic rhinitis due to pollen: Secondary | ICD-10-CM | POA: Diagnosis not present

## 2021-05-23 DIAGNOSIS — J45909 Unspecified asthma, uncomplicated: Secondary | ICD-10-CM | POA: Diagnosis not present

## 2021-05-23 DIAGNOSIS — Z6841 Body Mass Index (BMI) 40.0 and over, adult: Secondary | ICD-10-CM | POA: Diagnosis not present

## 2021-05-23 DIAGNOSIS — Z23 Encounter for immunization: Secondary | ICD-10-CM | POA: Diagnosis not present

## 2021-05-23 DIAGNOSIS — G479 Sleep disorder, unspecified: Secondary | ICD-10-CM | POA: Diagnosis not present

## 2021-05-23 DIAGNOSIS — E559 Vitamin D deficiency, unspecified: Secondary | ICD-10-CM | POA: Diagnosis not present

## 2021-05-23 DIAGNOSIS — E782 Mixed hyperlipidemia: Secondary | ICD-10-CM | POA: Diagnosis not present

## 2021-05-23 DIAGNOSIS — E119 Type 2 diabetes mellitus without complications: Secondary | ICD-10-CM | POA: Diagnosis not present

## 2021-05-23 DIAGNOSIS — I10 Essential (primary) hypertension: Secondary | ICD-10-CM | POA: Diagnosis not present

## 2021-05-23 DIAGNOSIS — R69 Illness, unspecified: Secondary | ICD-10-CM | POA: Diagnosis not present

## 2021-05-23 DIAGNOSIS — E538 Deficiency of other specified B group vitamins: Secondary | ICD-10-CM | POA: Diagnosis not present

## 2021-12-09 DIAGNOSIS — E119 Type 2 diabetes mellitus without complications: Secondary | ICD-10-CM | POA: Diagnosis not present

## 2021-12-09 DIAGNOSIS — E559 Vitamin D deficiency, unspecified: Secondary | ICD-10-CM | POA: Diagnosis not present

## 2021-12-12 DIAGNOSIS — Z0000 Encounter for general adult medical examination without abnormal findings: Secondary | ICD-10-CM | POA: Diagnosis not present

## 2021-12-12 DIAGNOSIS — E538 Deficiency of other specified B group vitamins: Secondary | ICD-10-CM | POA: Diagnosis not present

## 2021-12-12 DIAGNOSIS — Z6841 Body Mass Index (BMI) 40.0 and over, adult: Secondary | ICD-10-CM | POA: Diagnosis not present

## 2021-12-12 DIAGNOSIS — E1159 Type 2 diabetes mellitus with other circulatory complications: Secondary | ICD-10-CM | POA: Diagnosis not present

## 2021-12-12 DIAGNOSIS — G479 Sleep disorder, unspecified: Secondary | ICD-10-CM | POA: Diagnosis not present

## 2021-12-12 DIAGNOSIS — E782 Mixed hyperlipidemia: Secondary | ICD-10-CM | POA: Diagnosis not present

## 2021-12-12 DIAGNOSIS — J301 Allergic rhinitis due to pollen: Secondary | ICD-10-CM | POA: Diagnosis not present

## 2021-12-12 DIAGNOSIS — R69 Illness, unspecified: Secondary | ICD-10-CM | POA: Diagnosis not present

## 2021-12-12 DIAGNOSIS — J45909 Unspecified asthma, uncomplicated: Secondary | ICD-10-CM | POA: Diagnosis not present

## 2021-12-12 DIAGNOSIS — D573 Sickle-cell trait: Secondary | ICD-10-CM | POA: Diagnosis not present

## 2021-12-12 DIAGNOSIS — I10 Essential (primary) hypertension: Secondary | ICD-10-CM | POA: Diagnosis not present

## 2022-03-20 DIAGNOSIS — Z1231 Encounter for screening mammogram for malignant neoplasm of breast: Secondary | ICD-10-CM | POA: Diagnosis not present

## 2022-05-26 DIAGNOSIS — M9901 Segmental and somatic dysfunction of cervical region: Secondary | ICD-10-CM | POA: Diagnosis not present

## 2022-05-26 DIAGNOSIS — M9905 Segmental and somatic dysfunction of pelvic region: Secondary | ICD-10-CM | POA: Diagnosis not present

## 2022-05-26 DIAGNOSIS — M5137 Other intervertebral disc degeneration, lumbosacral region: Secondary | ICD-10-CM | POA: Diagnosis not present

## 2022-05-26 DIAGNOSIS — M9903 Segmental and somatic dysfunction of lumbar region: Secondary | ICD-10-CM | POA: Diagnosis not present

## 2022-05-27 DIAGNOSIS — M5137 Other intervertebral disc degeneration, lumbosacral region: Secondary | ICD-10-CM | POA: Diagnosis not present

## 2022-05-27 DIAGNOSIS — M9903 Segmental and somatic dysfunction of lumbar region: Secondary | ICD-10-CM | POA: Diagnosis not present

## 2022-05-27 DIAGNOSIS — M9905 Segmental and somatic dysfunction of pelvic region: Secondary | ICD-10-CM | POA: Diagnosis not present

## 2022-05-27 DIAGNOSIS — M9901 Segmental and somatic dysfunction of cervical region: Secondary | ICD-10-CM | POA: Diagnosis not present

## 2022-05-28 DIAGNOSIS — M5137 Other intervertebral disc degeneration, lumbosacral region: Secondary | ICD-10-CM | POA: Diagnosis not present

## 2022-05-28 DIAGNOSIS — M9905 Segmental and somatic dysfunction of pelvic region: Secondary | ICD-10-CM | POA: Diagnosis not present

## 2022-05-28 DIAGNOSIS — M9903 Segmental and somatic dysfunction of lumbar region: Secondary | ICD-10-CM | POA: Diagnosis not present

## 2022-05-28 DIAGNOSIS — M9901 Segmental and somatic dysfunction of cervical region: Secondary | ICD-10-CM | POA: Diagnosis not present

## 2022-06-02 DIAGNOSIS — M5137 Other intervertebral disc degeneration, lumbosacral region: Secondary | ICD-10-CM | POA: Diagnosis not present

## 2022-06-02 DIAGNOSIS — Z713 Dietary counseling and surveillance: Secondary | ICD-10-CM | POA: Diagnosis not present

## 2022-06-02 DIAGNOSIS — M9901 Segmental and somatic dysfunction of cervical region: Secondary | ICD-10-CM | POA: Diagnosis not present

## 2022-06-02 DIAGNOSIS — Z6836 Body mass index (BMI) 36.0-36.9, adult: Secondary | ICD-10-CM | POA: Diagnosis not present

## 2022-06-02 DIAGNOSIS — M9905 Segmental and somatic dysfunction of pelvic region: Secondary | ICD-10-CM | POA: Diagnosis not present

## 2022-06-02 DIAGNOSIS — Z131 Encounter for screening for diabetes mellitus: Secondary | ICD-10-CM | POA: Diagnosis not present

## 2022-06-02 DIAGNOSIS — M9903 Segmental and somatic dysfunction of lumbar region: Secondary | ICD-10-CM | POA: Diagnosis not present

## 2022-06-02 DIAGNOSIS — Z13220 Encounter for screening for lipoid disorders: Secondary | ICD-10-CM | POA: Diagnosis not present

## 2022-06-03 DIAGNOSIS — M9905 Segmental and somatic dysfunction of pelvic region: Secondary | ICD-10-CM | POA: Diagnosis not present

## 2022-06-03 DIAGNOSIS — M9903 Segmental and somatic dysfunction of lumbar region: Secondary | ICD-10-CM | POA: Diagnosis not present

## 2022-06-03 DIAGNOSIS — M5137 Other intervertebral disc degeneration, lumbosacral region: Secondary | ICD-10-CM | POA: Diagnosis not present

## 2022-06-03 DIAGNOSIS — M9901 Segmental and somatic dysfunction of cervical region: Secondary | ICD-10-CM | POA: Diagnosis not present

## 2022-06-04 DIAGNOSIS — M9901 Segmental and somatic dysfunction of cervical region: Secondary | ICD-10-CM | POA: Diagnosis not present

## 2022-06-04 DIAGNOSIS — M9903 Segmental and somatic dysfunction of lumbar region: Secondary | ICD-10-CM | POA: Diagnosis not present

## 2022-06-04 DIAGNOSIS — M5137 Other intervertebral disc degeneration, lumbosacral region: Secondary | ICD-10-CM | POA: Diagnosis not present

## 2022-06-04 DIAGNOSIS — M9905 Segmental and somatic dysfunction of pelvic region: Secondary | ICD-10-CM | POA: Diagnosis not present

## 2022-06-08 DIAGNOSIS — M9903 Segmental and somatic dysfunction of lumbar region: Secondary | ICD-10-CM | POA: Diagnosis not present

## 2022-06-08 DIAGNOSIS — M9905 Segmental and somatic dysfunction of pelvic region: Secondary | ICD-10-CM | POA: Diagnosis not present

## 2022-06-08 DIAGNOSIS — M9901 Segmental and somatic dysfunction of cervical region: Secondary | ICD-10-CM | POA: Diagnosis not present

## 2022-06-08 DIAGNOSIS — M5137 Other intervertebral disc degeneration, lumbosacral region: Secondary | ICD-10-CM | POA: Diagnosis not present

## 2022-06-09 DIAGNOSIS — M9901 Segmental and somatic dysfunction of cervical region: Secondary | ICD-10-CM | POA: Diagnosis not present

## 2022-06-09 DIAGNOSIS — M9905 Segmental and somatic dysfunction of pelvic region: Secondary | ICD-10-CM | POA: Diagnosis not present

## 2022-06-09 DIAGNOSIS — M9903 Segmental and somatic dysfunction of lumbar region: Secondary | ICD-10-CM | POA: Diagnosis not present

## 2022-06-09 DIAGNOSIS — M5137 Other intervertebral disc degeneration, lumbosacral region: Secondary | ICD-10-CM | POA: Diagnosis not present

## 2022-06-11 DIAGNOSIS — M5137 Other intervertebral disc degeneration, lumbosacral region: Secondary | ICD-10-CM | POA: Diagnosis not present

## 2022-06-11 DIAGNOSIS — M9903 Segmental and somatic dysfunction of lumbar region: Secondary | ICD-10-CM | POA: Diagnosis not present

## 2022-06-11 DIAGNOSIS — M9905 Segmental and somatic dysfunction of pelvic region: Secondary | ICD-10-CM | POA: Diagnosis not present

## 2022-06-11 DIAGNOSIS — M9901 Segmental and somatic dysfunction of cervical region: Secondary | ICD-10-CM | POA: Diagnosis not present

## 2022-06-15 DIAGNOSIS — M5137 Other intervertebral disc degeneration, lumbosacral region: Secondary | ICD-10-CM | POA: Diagnosis not present

## 2022-06-15 DIAGNOSIS — M9905 Segmental and somatic dysfunction of pelvic region: Secondary | ICD-10-CM | POA: Diagnosis not present

## 2022-06-15 DIAGNOSIS — M9901 Segmental and somatic dysfunction of cervical region: Secondary | ICD-10-CM | POA: Diagnosis not present

## 2022-06-15 DIAGNOSIS — M9903 Segmental and somatic dysfunction of lumbar region: Secondary | ICD-10-CM | POA: Diagnosis not present

## 2022-06-16 DIAGNOSIS — M9905 Segmental and somatic dysfunction of pelvic region: Secondary | ICD-10-CM | POA: Diagnosis not present

## 2022-06-16 DIAGNOSIS — M5137 Other intervertebral disc degeneration, lumbosacral region: Secondary | ICD-10-CM | POA: Diagnosis not present

## 2022-06-16 DIAGNOSIS — M9901 Segmental and somatic dysfunction of cervical region: Secondary | ICD-10-CM | POA: Diagnosis not present

## 2022-06-16 DIAGNOSIS — M9903 Segmental and somatic dysfunction of lumbar region: Secondary | ICD-10-CM | POA: Diagnosis not present

## 2022-06-18 DIAGNOSIS — I10 Essential (primary) hypertension: Secondary | ICD-10-CM | POA: Diagnosis not present

## 2022-06-18 DIAGNOSIS — J301 Allergic rhinitis due to pollen: Secondary | ICD-10-CM | POA: Diagnosis not present

## 2022-06-18 DIAGNOSIS — Z6838 Body mass index (BMI) 38.0-38.9, adult: Secondary | ICD-10-CM | POA: Diagnosis not present

## 2022-06-18 DIAGNOSIS — E559 Vitamin D deficiency, unspecified: Secondary | ICD-10-CM | POA: Diagnosis not present

## 2022-06-18 DIAGNOSIS — F3342 Major depressive disorder, recurrent, in full remission: Secondary | ICD-10-CM | POA: Diagnosis not present

## 2022-06-18 DIAGNOSIS — M9905 Segmental and somatic dysfunction of pelvic region: Secondary | ICD-10-CM | POA: Diagnosis not present

## 2022-06-18 DIAGNOSIS — E782 Mixed hyperlipidemia: Secondary | ICD-10-CM | POA: Diagnosis not present

## 2022-06-18 DIAGNOSIS — J4520 Mild intermittent asthma, uncomplicated: Secondary | ICD-10-CM | POA: Diagnosis not present

## 2022-06-18 DIAGNOSIS — H35033 Hypertensive retinopathy, bilateral: Secondary | ICD-10-CM | POA: Diagnosis not present

## 2022-06-18 DIAGNOSIS — M9903 Segmental and somatic dysfunction of lumbar region: Secondary | ICD-10-CM | POA: Diagnosis not present

## 2022-06-18 DIAGNOSIS — M9901 Segmental and somatic dysfunction of cervical region: Secondary | ICD-10-CM | POA: Diagnosis not present

## 2022-06-18 DIAGNOSIS — E1159 Type 2 diabetes mellitus with other circulatory complications: Secondary | ICD-10-CM | POA: Diagnosis not present

## 2022-06-18 DIAGNOSIS — E538 Deficiency of other specified B group vitamins: Secondary | ICD-10-CM | POA: Diagnosis not present

## 2022-06-18 DIAGNOSIS — G479 Sleep disorder, unspecified: Secondary | ICD-10-CM | POA: Diagnosis not present

## 2022-06-18 DIAGNOSIS — M5137 Other intervertebral disc degeneration, lumbosacral region: Secondary | ICD-10-CM | POA: Diagnosis not present

## 2022-06-23 DIAGNOSIS — M9901 Segmental and somatic dysfunction of cervical region: Secondary | ICD-10-CM | POA: Diagnosis not present

## 2022-06-23 DIAGNOSIS — M9903 Segmental and somatic dysfunction of lumbar region: Secondary | ICD-10-CM | POA: Diagnosis not present

## 2022-06-23 DIAGNOSIS — M9905 Segmental and somatic dysfunction of pelvic region: Secondary | ICD-10-CM | POA: Diagnosis not present

## 2022-06-23 DIAGNOSIS — M5137 Other intervertebral disc degeneration, lumbosacral region: Secondary | ICD-10-CM | POA: Diagnosis not present

## 2022-06-25 DIAGNOSIS — M9905 Segmental and somatic dysfunction of pelvic region: Secondary | ICD-10-CM | POA: Diagnosis not present

## 2022-06-25 DIAGNOSIS — M5137 Other intervertebral disc degeneration, lumbosacral region: Secondary | ICD-10-CM | POA: Diagnosis not present

## 2022-06-25 DIAGNOSIS — M9903 Segmental and somatic dysfunction of lumbar region: Secondary | ICD-10-CM | POA: Diagnosis not present

## 2022-06-25 DIAGNOSIS — M9901 Segmental and somatic dysfunction of cervical region: Secondary | ICD-10-CM | POA: Diagnosis not present

## 2022-06-30 DIAGNOSIS — M9903 Segmental and somatic dysfunction of lumbar region: Secondary | ICD-10-CM | POA: Diagnosis not present

## 2022-06-30 DIAGNOSIS — M9901 Segmental and somatic dysfunction of cervical region: Secondary | ICD-10-CM | POA: Diagnosis not present

## 2022-06-30 DIAGNOSIS — M9905 Segmental and somatic dysfunction of pelvic region: Secondary | ICD-10-CM | POA: Diagnosis not present

## 2022-06-30 DIAGNOSIS — M5137 Other intervertebral disc degeneration, lumbosacral region: Secondary | ICD-10-CM | POA: Diagnosis not present

## 2022-07-02 DIAGNOSIS — M9903 Segmental and somatic dysfunction of lumbar region: Secondary | ICD-10-CM | POA: Diagnosis not present

## 2022-07-02 DIAGNOSIS — M9905 Segmental and somatic dysfunction of pelvic region: Secondary | ICD-10-CM | POA: Diagnosis not present

## 2022-07-02 DIAGNOSIS — M9901 Segmental and somatic dysfunction of cervical region: Secondary | ICD-10-CM | POA: Diagnosis not present

## 2022-07-02 DIAGNOSIS — M5137 Other intervertebral disc degeneration, lumbosacral region: Secondary | ICD-10-CM | POA: Diagnosis not present

## 2022-07-07 DIAGNOSIS — M9901 Segmental and somatic dysfunction of cervical region: Secondary | ICD-10-CM | POA: Diagnosis not present

## 2022-07-07 DIAGNOSIS — M9905 Segmental and somatic dysfunction of pelvic region: Secondary | ICD-10-CM | POA: Diagnosis not present

## 2022-07-07 DIAGNOSIS — M5137 Other intervertebral disc degeneration, lumbosacral region: Secondary | ICD-10-CM | POA: Diagnosis not present

## 2022-07-07 DIAGNOSIS — M9903 Segmental and somatic dysfunction of lumbar region: Secondary | ICD-10-CM | POA: Diagnosis not present

## 2022-07-14 DIAGNOSIS — M9901 Segmental and somatic dysfunction of cervical region: Secondary | ICD-10-CM | POA: Diagnosis not present

## 2022-07-14 DIAGNOSIS — M9903 Segmental and somatic dysfunction of lumbar region: Secondary | ICD-10-CM | POA: Diagnosis not present

## 2022-07-14 DIAGNOSIS — M5137 Other intervertebral disc degeneration, lumbosacral region: Secondary | ICD-10-CM | POA: Diagnosis not present

## 2022-07-14 DIAGNOSIS — M9905 Segmental and somatic dysfunction of pelvic region: Secondary | ICD-10-CM | POA: Diagnosis not present

## 2022-07-16 DIAGNOSIS — M5137 Other intervertebral disc degeneration, lumbosacral region: Secondary | ICD-10-CM | POA: Diagnosis not present

## 2022-07-16 DIAGNOSIS — M9903 Segmental and somatic dysfunction of lumbar region: Secondary | ICD-10-CM | POA: Diagnosis not present

## 2022-07-16 DIAGNOSIS — M9905 Segmental and somatic dysfunction of pelvic region: Secondary | ICD-10-CM | POA: Diagnosis not present

## 2022-07-16 DIAGNOSIS — M9901 Segmental and somatic dysfunction of cervical region: Secondary | ICD-10-CM | POA: Diagnosis not present

## 2022-07-21 DIAGNOSIS — M9901 Segmental and somatic dysfunction of cervical region: Secondary | ICD-10-CM | POA: Diagnosis not present

## 2022-07-21 DIAGNOSIS — M9905 Segmental and somatic dysfunction of pelvic region: Secondary | ICD-10-CM | POA: Diagnosis not present

## 2022-07-21 DIAGNOSIS — M9903 Segmental and somatic dysfunction of lumbar region: Secondary | ICD-10-CM | POA: Diagnosis not present

## 2022-07-21 DIAGNOSIS — M5137 Other intervertebral disc degeneration, lumbosacral region: Secondary | ICD-10-CM | POA: Diagnosis not present

## 2022-07-23 DIAGNOSIS — M9905 Segmental and somatic dysfunction of pelvic region: Secondary | ICD-10-CM | POA: Diagnosis not present

## 2022-07-23 DIAGNOSIS — M5137 Other intervertebral disc degeneration, lumbosacral region: Secondary | ICD-10-CM | POA: Diagnosis not present

## 2022-07-23 DIAGNOSIS — M9901 Segmental and somatic dysfunction of cervical region: Secondary | ICD-10-CM | POA: Diagnosis not present

## 2022-07-23 DIAGNOSIS — M9903 Segmental and somatic dysfunction of lumbar region: Secondary | ICD-10-CM | POA: Diagnosis not present

## 2022-07-28 DIAGNOSIS — M9905 Segmental and somatic dysfunction of pelvic region: Secondary | ICD-10-CM | POA: Diagnosis not present

## 2022-07-28 DIAGNOSIS — M9903 Segmental and somatic dysfunction of lumbar region: Secondary | ICD-10-CM | POA: Diagnosis not present

## 2022-07-28 DIAGNOSIS — M9901 Segmental and somatic dysfunction of cervical region: Secondary | ICD-10-CM | POA: Diagnosis not present

## 2022-07-28 DIAGNOSIS — M5137 Other intervertebral disc degeneration, lumbosacral region: Secondary | ICD-10-CM | POA: Diagnosis not present

## 2022-07-30 DIAGNOSIS — M5137 Other intervertebral disc degeneration, lumbosacral region: Secondary | ICD-10-CM | POA: Diagnosis not present

## 2022-07-30 DIAGNOSIS — M9901 Segmental and somatic dysfunction of cervical region: Secondary | ICD-10-CM | POA: Diagnosis not present

## 2022-07-30 DIAGNOSIS — M9903 Segmental and somatic dysfunction of lumbar region: Secondary | ICD-10-CM | POA: Diagnosis not present

## 2022-07-30 DIAGNOSIS — M9905 Segmental and somatic dysfunction of pelvic region: Secondary | ICD-10-CM | POA: Diagnosis not present

## 2022-08-04 DIAGNOSIS — M9903 Segmental and somatic dysfunction of lumbar region: Secondary | ICD-10-CM | POA: Diagnosis not present

## 2022-08-04 DIAGNOSIS — M9901 Segmental and somatic dysfunction of cervical region: Secondary | ICD-10-CM | POA: Diagnosis not present

## 2022-08-04 DIAGNOSIS — M9905 Segmental and somatic dysfunction of pelvic region: Secondary | ICD-10-CM | POA: Diagnosis not present

## 2022-08-04 DIAGNOSIS — M5137 Other intervertebral disc degeneration, lumbosacral region: Secondary | ICD-10-CM | POA: Diagnosis not present

## 2022-08-06 DIAGNOSIS — M9901 Segmental and somatic dysfunction of cervical region: Secondary | ICD-10-CM | POA: Diagnosis not present

## 2022-08-06 DIAGNOSIS — M9903 Segmental and somatic dysfunction of lumbar region: Secondary | ICD-10-CM | POA: Diagnosis not present

## 2022-08-06 DIAGNOSIS — M5137 Other intervertebral disc degeneration, lumbosacral region: Secondary | ICD-10-CM | POA: Diagnosis not present

## 2022-08-06 DIAGNOSIS — M9905 Segmental and somatic dysfunction of pelvic region: Secondary | ICD-10-CM | POA: Diagnosis not present

## 2022-08-11 DIAGNOSIS — M5137 Other intervertebral disc degeneration, lumbosacral region: Secondary | ICD-10-CM | POA: Diagnosis not present

## 2022-08-11 DIAGNOSIS — M9901 Segmental and somatic dysfunction of cervical region: Secondary | ICD-10-CM | POA: Diagnosis not present

## 2022-08-11 DIAGNOSIS — M9903 Segmental and somatic dysfunction of lumbar region: Secondary | ICD-10-CM | POA: Diagnosis not present

## 2022-08-11 DIAGNOSIS — M9905 Segmental and somatic dysfunction of pelvic region: Secondary | ICD-10-CM | POA: Diagnosis not present

## 2022-08-18 DIAGNOSIS — M9903 Segmental and somatic dysfunction of lumbar region: Secondary | ICD-10-CM | POA: Diagnosis not present

## 2022-08-18 DIAGNOSIS — M9901 Segmental and somatic dysfunction of cervical region: Secondary | ICD-10-CM | POA: Diagnosis not present

## 2022-08-18 DIAGNOSIS — M5137 Other intervertebral disc degeneration, lumbosacral region: Secondary | ICD-10-CM | POA: Diagnosis not present

## 2022-08-18 DIAGNOSIS — M9905 Segmental and somatic dysfunction of pelvic region: Secondary | ICD-10-CM | POA: Diagnosis not present

## 2022-08-20 DIAGNOSIS — M9903 Segmental and somatic dysfunction of lumbar region: Secondary | ICD-10-CM | POA: Diagnosis not present

## 2022-08-20 DIAGNOSIS — M9905 Segmental and somatic dysfunction of pelvic region: Secondary | ICD-10-CM | POA: Diagnosis not present

## 2022-08-20 DIAGNOSIS — M5137 Other intervertebral disc degeneration, lumbosacral region: Secondary | ICD-10-CM | POA: Diagnosis not present

## 2022-08-20 DIAGNOSIS — M9901 Segmental and somatic dysfunction of cervical region: Secondary | ICD-10-CM | POA: Diagnosis not present

## 2022-08-27 DIAGNOSIS — M9905 Segmental and somatic dysfunction of pelvic region: Secondary | ICD-10-CM | POA: Diagnosis not present

## 2022-08-27 DIAGNOSIS — M9901 Segmental and somatic dysfunction of cervical region: Secondary | ICD-10-CM | POA: Diagnosis not present

## 2022-08-27 DIAGNOSIS — M9903 Segmental and somatic dysfunction of lumbar region: Secondary | ICD-10-CM | POA: Diagnosis not present

## 2022-08-27 DIAGNOSIS — M5137 Other intervertebral disc degeneration, lumbosacral region: Secondary | ICD-10-CM | POA: Diagnosis not present

## 2022-09-03 DIAGNOSIS — M9901 Segmental and somatic dysfunction of cervical region: Secondary | ICD-10-CM | POA: Diagnosis not present

## 2022-09-03 DIAGNOSIS — M9905 Segmental and somatic dysfunction of pelvic region: Secondary | ICD-10-CM | POA: Diagnosis not present

## 2022-09-03 DIAGNOSIS — M9903 Segmental and somatic dysfunction of lumbar region: Secondary | ICD-10-CM | POA: Diagnosis not present

## 2022-09-03 DIAGNOSIS — M5137 Other intervertebral disc degeneration, lumbosacral region: Secondary | ICD-10-CM | POA: Diagnosis not present

## 2022-09-14 DIAGNOSIS — M5137 Other intervertebral disc degeneration, lumbosacral region: Secondary | ICD-10-CM | POA: Diagnosis not present

## 2022-09-14 DIAGNOSIS — M9905 Segmental and somatic dysfunction of pelvic region: Secondary | ICD-10-CM | POA: Diagnosis not present

## 2022-09-14 DIAGNOSIS — M9901 Segmental and somatic dysfunction of cervical region: Secondary | ICD-10-CM | POA: Diagnosis not present

## 2022-09-14 DIAGNOSIS — M9903 Segmental and somatic dysfunction of lumbar region: Secondary | ICD-10-CM | POA: Diagnosis not present

## 2023-01-04 DIAGNOSIS — I10 Essential (primary) hypertension: Secondary | ICD-10-CM | POA: Diagnosis not present

## 2023-01-04 DIAGNOSIS — H35033 Hypertensive retinopathy, bilateral: Secondary | ICD-10-CM | POA: Diagnosis not present

## 2023-01-04 DIAGNOSIS — F3342 Major depressive disorder, recurrent, in full remission: Secondary | ICD-10-CM | POA: Diagnosis not present

## 2023-01-04 DIAGNOSIS — Z6836 Body mass index (BMI) 36.0-36.9, adult: Secondary | ICD-10-CM | POA: Diagnosis not present

## 2023-01-04 DIAGNOSIS — E538 Deficiency of other specified B group vitamins: Secondary | ICD-10-CM | POA: Diagnosis not present

## 2023-01-04 DIAGNOSIS — Z0000 Encounter for general adult medical examination without abnormal findings: Secondary | ICD-10-CM | POA: Diagnosis not present

## 2023-01-04 DIAGNOSIS — E1159 Type 2 diabetes mellitus with other circulatory complications: Secondary | ICD-10-CM | POA: Diagnosis not present

## 2023-01-04 DIAGNOSIS — E559 Vitamin D deficiency, unspecified: Secondary | ICD-10-CM | POA: Diagnosis not present

## 2023-01-04 DIAGNOSIS — J4520 Mild intermittent asthma, uncomplicated: Secondary | ICD-10-CM | POA: Diagnosis not present

## 2023-01-04 DIAGNOSIS — M654 Radial styloid tenosynovitis [de Quervain]: Secondary | ICD-10-CM | POA: Diagnosis not present

## 2023-01-04 DIAGNOSIS — E782 Mixed hyperlipidemia: Secondary | ICD-10-CM | POA: Diagnosis not present

## 2023-03-26 DIAGNOSIS — Z1231 Encounter for screening mammogram for malignant neoplasm of breast: Secondary | ICD-10-CM | POA: Diagnosis not present

## 2023-10-28 ENCOUNTER — Other Ambulatory Visit: Payer: Self-pay

## 2023-10-28 ENCOUNTER — Encounter: Payer: Self-pay | Admitting: Neurology

## 2023-10-28 DIAGNOSIS — R202 Paresthesia of skin: Secondary | ICD-10-CM

## 2024-01-11 ENCOUNTER — Ambulatory Visit: Admitting: Neurology

## 2024-01-11 DIAGNOSIS — R202 Paresthesia of skin: Secondary | ICD-10-CM | POA: Diagnosis not present

## 2024-01-11 DIAGNOSIS — G5603 Carpal tunnel syndrome, bilateral upper limbs: Secondary | ICD-10-CM

## 2024-01-11 NOTE — Procedures (Signed)
 " [facility]  [address] [place], [address]  [place], [place] [address] Tel: [phone] Fax: [phone] Test Date:  01/11/2024  Patient: Ann Mills DOB: 28-Nov-1960 Physician: Venetia Potters, MD  Sex: Female Height: 5' 10 Ref Phys: Aleck Dawn, MD  ID#: [id number] Temp: 34.5 C Technician:    History: This is a 64 year old female with wrist/hand pain.  NCV & EMG Findings: Extensive electrodiagnostic evaluation of the right upper limb with additional nerve conduction studies of the left upper limb shows: Right median sensory response shows prolonged distal peak latency (4.2 ms). Left median-ulnar palmar sensory response shows prolonged distal peak latency (Median Palm-Wrist, 2.5 ms) and abnormal peak latency difference ((Median Palm-Wrist)-(Ulnar Palm-Wrist), 0.62 ms). Left median, right ulnar, and right radial sensory responses are within normal limits. Right median (APB) and ulnar (ADM) motor responses are within normal limits. There is no evidence of active or chronic motor axon loss changes affecting any of the tested muscles on needle examination. Motor unit configuration and recruitment pattern is within normal limits.  Impression: This is an abnormal study. The findings are most consistent with the following: Bilateral median mononeuropathy at or distal to the wrist, consistent with carpal tunnel syndrome. The findings are mild in degree electrically on the right and very mild in degree electrically on the left. No electrodiagnostic evidence of a right cervical (C5-C8) motor radiculopathy. Screening studies for right ulnar or radial mononeuropathies are normal.    ___________________________ Venetia Potters, MD    Nerve Conduction Studies Motor Nerve Results    Latency Amplitude F-Lat Segment Distance CV Comment  Site (ms) Norm (mV) Norm (ms)  (cm) (m/s) Norm   Right Median (APB) Motor  Wrist 3.9  < 4.0 7.9  > 5.0        Elbow 9.1 - 6.9 -  Elbow-Wrist 30 58  >  50   Right Ulnar (ADM) Motor  Wrist 1.95  < 3.1 8.4  > 7.0        Bel elbow 6.0 - 8.4 -  Bel elbow-Wrist 24.5 60  > 50   Ab elbow 7.6 - 8.2 -  Ab elbow-Bel elbow 10 63 -    Sensory Sites    Neg Peak Lat Amplitude (O-P) Segment Distance Velocity Comment  Site (ms) Norm (V) Norm  (cm) (ms)   Left Median Sensory  Wrist-Dig II 3.5  < 3.8 32  > 10 Wrist-Dig II 13    Right Median Sensory  Wrist-Dig II *4.2  < 3.8 15  > 10 Wrist-Dig II 13    Left Median-Ulnar Palmar Sensory       Median  Palm-Wrist *2.5  < 2.2 29  > 10 Palm-Wrist 8         Ulnar  Palm-Wrist 1.88  < 2.2 15  > 5 Palm-Wrist 8    Right Radial Sensory  Forearm-Wrist 2.2  < 2.8 27  > 10 Forearm-Wrist 10    Right Ulnar Sensory  Wrist-Dig V 2.7  < 3.2 29  > 5 Wrist-Dig V 11     Inter-Nerve Comparisons   Nerve 1 Value 1 Nerve 2 Value 2 Parameter Result Normal  Sensory Sites  L Median Palm-Wrist 2.5 ms L Ulnar Palm-Wrist 1.88 ms Peak Lat Diff *0.62 ms <0.40   Electromyography   Side Muscle Ins.Act Fibs Fasc Recrt Amp Dur Poly Activation Comment  Right FDI Nml Nml Nml Nml Nml Nml Nml Nml N/A  Right Pronator teres Nml Nml Nml Nml Nml  Nml Nml Nml N/A  Right Biceps Nml Nml Nml Nml Nml Nml Nml Nml N/A  Right Triceps Nml Nml Nml Nml Nml Nml Nml Nml N/A  Right Deltoid Nml Nml Nml Nml Nml Nml Nml Nml N/A      Waveforms:  Motor      Sensory             "
